# Patient Record
Sex: Female | Born: 1975
Health system: Southern US, Community
[De-identification: ages and names within clinical notes are randomized; demographics above are authoritative.]

## PROBLEM LIST (undated history)

## (undated) DIAGNOSIS — D069 Carcinoma in situ of cervix, unspecified: Secondary | ICD-10-CM

## (undated) DIAGNOSIS — L709 Acne, unspecified: Secondary | ICD-10-CM

## (undated) DIAGNOSIS — M419 Scoliosis, unspecified: Secondary | ICD-10-CM

## (undated) DIAGNOSIS — D649 Anemia, unspecified: Secondary | ICD-10-CM

## (undated) DIAGNOSIS — K219 Gastro-esophageal reflux disease without esophagitis: Secondary | ICD-10-CM

## (undated) DIAGNOSIS — F419 Anxiety disorder, unspecified: Secondary | ICD-10-CM

## (undated) HISTORY — DX: Acne, unspecified: L70.9

## (undated) HISTORY — DX: Carcinoma in situ of cervix, unspecified: D06.9

## (undated) HISTORY — DX: Scoliosis, unspecified: M41.9

## (undated) HISTORY — DX: Gastro-esophageal reflux disease without esophagitis: K21.9

## (undated) HISTORY — PX: COLONOSCOPY: SHX174

## (undated) HISTORY — PX: COSMETIC SURGERY: SHX468

## (undated) HISTORY — DX: Anxiety disorder, unspecified: F41.9

---

## 2003-06-11 HISTORY — PX: LEEP: SHX91

## 2004-03-01 ENCOUNTER — Other Ambulatory Visit: Admission: RE | Admit: 2004-03-01 | Discharge: 2004-03-01 | Payer: Self-pay | Admitting: Obstetrics and Gynecology

## 2004-04-04 ENCOUNTER — Encounter (INDEPENDENT_AMBULATORY_CARE_PROVIDER_SITE_OTHER): Payer: Self-pay | Admitting: Specialist

## 2004-04-04 ENCOUNTER — Ambulatory Visit (HOSPITAL_COMMUNITY): Admission: RE | Admit: 2004-04-04 | Discharge: 2004-04-04 | Payer: Self-pay | Admitting: Obstetrics and Gynecology

## 2004-08-16 ENCOUNTER — Other Ambulatory Visit: Admission: RE | Admit: 2004-08-16 | Discharge: 2004-08-16 | Payer: Self-pay | Admitting: Obstetrics and Gynecology

## 2004-11-23 ENCOUNTER — Other Ambulatory Visit: Admission: RE | Admit: 2004-11-23 | Discharge: 2004-11-23 | Payer: Self-pay | Admitting: Obstetrics and Gynecology

## 2005-03-20 ENCOUNTER — Other Ambulatory Visit: Admission: RE | Admit: 2005-03-20 | Discharge: 2005-03-20 | Payer: Self-pay | Admitting: Obstetrics and Gynecology

## 2005-06-27 ENCOUNTER — Other Ambulatory Visit: Admission: RE | Admit: 2005-06-27 | Discharge: 2005-06-27 | Payer: Self-pay | Admitting: Obstetrics and Gynecology

## 2006-04-09 ENCOUNTER — Other Ambulatory Visit: Admission: RE | Admit: 2006-04-09 | Discharge: 2006-04-09 | Payer: Self-pay | Admitting: Obstetrics and Gynecology

## 2006-06-10 HISTORY — PX: AUGMENTATION MAMMAPLASTY: SUR837

## 2006-08-04 ENCOUNTER — Encounter: Admission: RE | Admit: 2006-08-04 | Discharge: 2006-08-04 | Payer: Self-pay | Admitting: Physician Assistant

## 2006-08-04 ENCOUNTER — Ambulatory Visit: Payer: Self-pay | Admitting: Family Medicine

## 2006-08-06 ENCOUNTER — Encounter: Admission: RE | Admit: 2006-08-06 | Discharge: 2006-08-06 | Payer: Self-pay | Admitting: Physician Assistant

## 2007-09-09 ENCOUNTER — Ambulatory Visit: Payer: Self-pay | Admitting: Family Medicine

## 2008-05-16 ENCOUNTER — Ambulatory Visit: Payer: Self-pay | Admitting: Family Medicine

## 2008-05-30 ENCOUNTER — Encounter: Admission: RE | Admit: 2008-05-30 | Discharge: 2008-05-30 | Payer: Self-pay | Admitting: Family Medicine

## 2008-05-30 ENCOUNTER — Ambulatory Visit: Payer: Self-pay | Admitting: Family Medicine

## 2009-07-24 ENCOUNTER — Ambulatory Visit: Payer: Self-pay | Admitting: Family Medicine

## 2009-12-18 ENCOUNTER — Ambulatory Visit: Payer: Self-pay | Admitting: Physician Assistant

## 2010-02-28 ENCOUNTER — Ambulatory Visit (HOSPITAL_COMMUNITY): Admission: RE | Admit: 2010-02-28 | Discharge: 2010-02-28 | Payer: Self-pay | Admitting: Obstetrics and Gynecology

## 2010-07-31 ENCOUNTER — Ambulatory Visit (INDEPENDENT_AMBULATORY_CARE_PROVIDER_SITE_OTHER): Payer: BC Managed Care – PPO | Admitting: Family Medicine

## 2010-07-31 DIAGNOSIS — J029 Acute pharyngitis, unspecified: Secondary | ICD-10-CM

## 2010-07-31 DIAGNOSIS — J209 Acute bronchitis, unspecified: Secondary | ICD-10-CM

## 2010-08-13 ENCOUNTER — Inpatient Hospital Stay (HOSPITAL_COMMUNITY): Admission: AD | Admit: 2010-08-13 | Payer: Self-pay | Admitting: Obstetrics and Gynecology

## 2010-08-17 ENCOUNTER — Inpatient Hospital Stay (HOSPITAL_COMMUNITY)
Admission: RE | Admit: 2010-08-17 | Discharge: 2010-08-19 | DRG: 373 | Disposition: A | Discharge status: Home / Self Care | Payer: BC Managed Care – PPO | Source: Ambulatory Visit | Attending: Obstetrics and Gynecology | Admitting: Obstetrics and Gynecology

## 2010-08-17 DIAGNOSIS — O99892 Other specified diseases and conditions complicating childbirth: Secondary | ICD-10-CM | POA: Diagnosis present

## 2010-08-17 DIAGNOSIS — Z22330 Carrier of Group B streptococcus: Secondary | ICD-10-CM

## 2010-08-17 DIAGNOSIS — O09529 Supervision of elderly multigravida, unspecified trimester: Secondary | ICD-10-CM | POA: Diagnosis present

## 2010-08-17 LAB — CBC
HCT: 34.8 % — ABNORMAL LOW (ref 36.0–46.0)
Hemoglobin: 11.5 g/dL — ABNORMAL LOW (ref 12.0–15.0)
MCH: 28.5 pg (ref 26.0–34.0)
MCV: 86.1 fL (ref 78.0–100.0)
Platelets: 278 10*3/uL (ref 150–400)
RDW: 12.8 % (ref 11.5–15.5)
WBC: 8.1 10*3/uL (ref 4.0–10.5)

## 2010-08-18 LAB — CBC
HCT: 30 % — ABNORMAL LOW (ref 36.0–46.0)
Hemoglobin: 9.9 g/dL — ABNORMAL LOW (ref 12.0–15.0)
MCH: 28.6 pg (ref 26.0–34.0)
MCV: 86.7 fL (ref 78.0–100.0)
RBC: 3.46 MIL/uL — ABNORMAL LOW (ref 3.87–5.11)

## 2010-08-20 ENCOUNTER — Ambulatory Visit (HOSPITAL_COMMUNITY)
Admission: RE | Admit: 2010-08-20 | Discharge: 2010-08-20 | Disposition: A | Discharge status: Home / Self Care | Payer: BC Managed Care – PPO | Source: Ambulatory Visit | Attending: Obstetrics & Gynecology | Admitting: Obstetrics & Gynecology

## 2010-08-20 DIAGNOSIS — O9279 Other disorders of lactation: Secondary | ICD-10-CM | POA: Insufficient documentation

## 2010-08-20 DIAGNOSIS — O9212 Cracked nipple associated with the puerperium: Secondary | ICD-10-CM | POA: Insufficient documentation

## 2010-08-27 ENCOUNTER — Ambulatory Visit (HOSPITAL_COMMUNITY)
Admission: RE | Admit: 2010-08-27 | Discharge: 2010-08-27 | Disposition: A | Discharge status: Home / Self Care | Payer: BC Managed Care – PPO | Source: Ambulatory Visit | Attending: Obstetrics & Gynecology | Admitting: Obstetrics & Gynecology

## 2010-08-27 DIAGNOSIS — O923 Agalactia: Secondary | ICD-10-CM | POA: Insufficient documentation

## 2010-10-09 ENCOUNTER — Ambulatory Visit (INDEPENDENT_AMBULATORY_CARE_PROVIDER_SITE_OTHER): Payer: BC Managed Care – PPO | Admitting: Family Medicine

## 2010-10-09 ENCOUNTER — Ambulatory Visit: Payer: BC Managed Care – PPO | Admitting: Family Medicine

## 2010-10-09 DIAGNOSIS — N61 Mastitis without abscess: Secondary | ICD-10-CM

## 2010-10-26 NOTE — Op Note (Signed)
NAME:  Vanessa Daniels, Vanessa Daniels             ACCOUNT NO.:  [id number]   MEDICAL RECORD NO.:  [id number]          PATIENT TYPE:  AMB   LOCATION:  SDC                           FACILITY:  [facility]   PHYSICIAN:  James A. Ashley Royalty, M.D.DATE OF BIRTH:  1976-01-08   DATE OF PROCEDURE:  04/04/2004  DATE OF DISCHARGE:                                 OPERATIVE REPORT   PREOPERATIVE DIAGNOSES:  1.  CIN 3.  2.  Recent near syncopal episode during colposcopy at the office.   POSTOPERATIVE DIAGNOSES:  1.  CIN 3.  2.  Recent near syncopal episode during colposcopy at the office.  Path      pending.   PROCEDURE:  Loop electrical excision procedure (LEEP).   SURGEON:  Rudy Jew. Ashley Royalty, M.D.   ANESTHESIA:  Monitored anesthesia care with 1% Xylocaine paracervical block  (15 mL).   COMPLICATIONS:  None.   PACKS/DRAINS:  None.   DESCRIPTION OF PROCEDURE:  The patient was taken to the operating room and  placed in the dorsal supine position.  After IV sedation was administered,  she was placed in the lithotomy position.  A coated speculum was placed per  vagina with _________ smoke evacuation.  The cervix was then visualized  through the colposcope.  5% acetic acid was applied.  A sizeable area of  white epithelium was noted anteriorly. The squamocolumnar junction was seen  in its entirety.  Next, the cervix was cleansed with Betadine and grasped on  the anterior lip with the single tooth tenaculum.  Approximately 15 mL of 1%  Xylocaine were infiltrated circumferentially to create a paracervical block.  Next the white [facility] loop electrode was incorporated in order to  perform the loop electrical excision. Using the 70 watts cutting wave form  and visualized through the colposcope, the procedure was easily and  satisfactorily conducted. The entire lesion was excised. The specimen was  incised at 12 o'clock and submitted to pathology for histologic studies in  saline.  Hemostasis was obtained using the  ball electrode at approximately  70 watts coagulation wave form. Hemostasis was noted.  Monsel solution was  applied to the cervical bed as an added protector from subsequent bleeding.  Hemostasis was noted and the procedure terminated. All instruments were  removed and the patient taken to the recovery room in satisfactory  condition.      JAM/MEDQ  D:  04/04/2004  T:  04/04/2004  Job:  [id number]

## 2010-10-26 NOTE — H&P (Signed)
NAME:  Vanessa Daniels, Vanessa Daniels             ACCOUNT NO.:  [id number]   MEDICAL RECORD NO.:  [id number]          PATIENT TYPE:  AMB   LOCATION:  SDC                           [facility]ACILITY:  [facility]   PHYSICIAN:  James A. Ashley Royalty, M.D.DATE OF BIRTH:  Apr 24, 1976   DATE OF ADMISSION:  DATE OF DISCHARGE:                                HISTORY & PHYSICAL   DATE OF ADMISSION:  April 04, 2004   HISTORY OF PRESENT ILLNESS:  This is a 35 year old gravida 2 para 0 AB 2  referred for evaluation of a Bartholin cyst.  In the course of the  evaluation, a Pap smear was done which revealed high-risk HPV.  Subsequent  colposcopy revealed  large area of apparent mosaicism extending to  approximately 1.2 cm from the squamocolumnar junction.  Three  colposcopically-directed biopsies were obtained, all of which showed CIN-3.  In the course of the procedure, the patient stated I don't feel well.  The  blood pressure and pulse remained normal and she maintained consciousness  throughout.  However, due to that event, it was felt the patient would best  be treated in a setting of monitored anesthesia.   MEDICATIONS:  Ortho-Novum 7/7/7.   PAST MEDICAL HISTORY:  Medical:  Negative.  Surgical:  Breast augmentation,  liposuction.   ALLERGIES:  None.   FAMILY HISTORY:  Positive for diabetes.   SOCIAL HISTORY:  The patient denies use of tobacco or significant alcohol.   REVIEW OF SYSTEMS:  Noncontributory.   PHYSICAL EXAMINATION:  GENERAL:  Well-developed,well-nourished, pleasant  female in no acute distress.  Afebrile, vital signs stable.  SKIN:  Warm and dry without lesions.  LYMPH:  There is no supraclavicular or inguinal adenopathy.  HEENT:  Normocephalic.  NECK:  Supple without thyromegaly.  CHEST:  Lungs are clear.  CARDIAC:  Regular rate and rhythm without murmurs, gallops, or rubs.  BREAST:  Deferred.  ABDOMEN:  Soft and nontender without masses or organomegaly.  Bowel sounds  are active.  MUSCULOSKELETAL:   No CVA tenderness.  PELVIC:  (March 01, 2004) External genitalia within normal limits.  Vagina and cervix are without gross lesions.  Bimanual examination reveals  the uterus to be approximately 8 x 4 x 4 cm and no adnexal masses are  palpable.   IMPRESSION:  1.  Cervical intraepithelial neoplasia grade 3 on colposcopically-directed      biopsies.  2.  Near-syncopal episode at the time of colposcopy.  3.  Status post breast augmentation and liposuction.   PLAN:  Loop electrical excision procedure.  Risks, benefits, complications,  and alternatives fully discussed with the patient.  She states she  understands and accepts.  Questions invited and answered.      JAM/MEDQ  D:  04/04/2004  T:  04/04/2004  Job:  [id number]

## 2011-02-26 ENCOUNTER — Encounter: Payer: Self-pay | Admitting: Family Medicine

## 2011-02-26 ENCOUNTER — Ambulatory Visit (INDEPENDENT_AMBULATORY_CARE_PROVIDER_SITE_OTHER): Payer: BC Managed Care – PPO | Admitting: Family Medicine

## 2011-02-26 VITALS — BP 110/70 | HR 74 | Temp 98.7°F | Wt 141.0 lb

## 2011-02-26 DIAGNOSIS — J069 Acute upper respiratory infection, unspecified: Secondary | ICD-10-CM

## 2011-02-26 DIAGNOSIS — B070 Plantar wart: Secondary | ICD-10-CM

## 2011-02-26 MED ORDER — CHLORPHENIRAMINE-HYDROCODONE 8-10 MG/5ML PO LQCR: 5.0000 mL | Freq: Two times a day (BID) | ORAL | Status: DC | PRN

## 2011-02-26 MED ORDER — VALACYCLOVIR HCL 1 G PO TABS: 2000.0000 mg | ORAL_TABLET | Freq: Two times a day (BID) | ORAL | Status: DC

## 2011-02-26 NOTE — Progress Notes (Signed)
  Subjective:    Patient ID: Vanessa Daniels, female    DOB: 1975/10/26, 35 y.o.   MRN: [id number]  HPI She has a five-day history that started with a slight sore throat followed by rhinorrhea, fever, malaise and 3 days later a dry cough. The cough is now productive. She has tried NyQuil and dextromethorphan with out success. She also has a lesion on her left foot that she would like evaluated . She would also like her Valtrex renewed. She usually gets herpes a BLS with this   Review of Systems     Objective:   Physical Exam alert and in no distress. Tympanic membranes and canals are normal. Throat is clear. Tonsils are normal. Neck is supple without adenopathy or thyromegaly. Cardiac exam shows a regular sinus rhythm without murmurs or gallops. Lungs are clear to auscultation.        Assessment & Plan:   1. URI, acute   2. Plantar wart of left foot    Supportive care for the URI with the use of Tussionex. Also recommend freezing the wart and if further trouble, return here.

## 2011-02-26 NOTE — Progress Notes (Signed)
Addended by: Ronnald Nian on: 02/26/2011 11:38 AM   Modules accepted: Orders

## 2011-02-26 NOTE — Patient Instructions (Signed)
Valtrex prescription written and instructions given on proper use for treatment of herpes labialis.

## 2011-04-15 ENCOUNTER — Other Ambulatory Visit: Payer: Self-pay | Admitting: Family Medicine

## 2011-04-15 MED ORDER — VALACYCLOVIR HCL 1 G PO TABS: 2000.0000 mg | ORAL_TABLET | Freq: Two times a day (BID) | ORAL | Status: AC

## 2011-05-16 ENCOUNTER — Encounter: Payer: Self-pay | Admitting: Internal Medicine

## 2011-05-23 ENCOUNTER — Other Ambulatory Visit (INDEPENDENT_AMBULATORY_CARE_PROVIDER_SITE_OTHER): Payer: BC Managed Care – PPO

## 2011-05-23 DIAGNOSIS — Z23 Encounter for immunization: Secondary | ICD-10-CM

## 2011-05-24 ENCOUNTER — Other Ambulatory Visit: Payer: BC Managed Care – PPO

## 2011-07-01 ENCOUNTER — Telehealth: Payer: Self-pay | Admitting: Family Medicine

## 2011-07-01 ENCOUNTER — Other Ambulatory Visit: Payer: Self-pay | Admitting: Family Medicine

## 2011-07-01 DIAGNOSIS — J019 Acute sinusitis, unspecified: Secondary | ICD-10-CM

## 2011-07-01 MED ORDER — SULFAMETHOXAZOLE-TRIMETHOPRIM 800-160 MG PO TABS: 1.0000 | ORAL_TABLET | Freq: Two times a day (BID) | ORAL | Status: AC

## 2011-07-01 MED ORDER — AMOXICILLIN-POT CLAVULANATE 875-125 MG PO TABS: 1.0000 | ORAL_TABLET | Freq: Two times a day (BID) | ORAL | Status: AC

## 2011-07-01 NOTE — Telephone Encounter (Signed)
Pt informed

## 2011-07-01 NOTE — Telephone Encounter (Signed)
Let her know that I called in a different antibiotic for her. 

## 2011-07-01 NOTE — Progress Notes (Signed)
She has been self-medicating with Amoxil for sinus disease. I will place her on Augmentin. She is to use the entire dosing and call me at the end of the dosing

## 2011-07-01 NOTE — Telephone Encounter (Signed)
Apparently Augmentin is upsetting her stomach, I will switch her to [facility]

## 2011-11-27 ENCOUNTER — Ambulatory Visit (INDEPENDENT_AMBULATORY_CARE_PROVIDER_SITE_OTHER): Payer: BC Managed Care – PPO | Admitting: Family Medicine

## 2011-11-27 VITALS — BP 114/70 | HR 116 | Wt 130.0 lb

## 2011-11-27 DIAGNOSIS — K120 Recurrent oral aphthae: Secondary | ICD-10-CM

## 2011-11-27 NOTE — Progress Notes (Signed)
  Subjective:    Patient ID: Vanessa Daniels, female    DOB: [birth year], 36 y.o.   MRN: [id number]  HPI She complains of a slightly uncomfortable lesion on the roof of the mouth on the right that she noted several days ago. No sore throat, earache, fever or chills.   Review of Systems     Objective:   Physical Exam A 0.5 cm erythematous ulcerated lesion noted on the hard palate to the right of the midline. No other lesions seen in the throat. Tongue is normal. Neck is supple without adenopathy or      Assessment & Plan:   1. Aphthous ulcer of mouth    I reassured her that nothing needed to be done other than supportive care.

## 2012-02-03 ENCOUNTER — Other Ambulatory Visit: Payer: Self-pay | Admitting: Family Medicine

## 2012-02-03 MED ORDER — ERYTHROMYCIN 5 MG/GM OP OINT: TOPICAL_OINTMENT | OPHTHALMIC | Status: DC

## 2012-04-07 ENCOUNTER — Ambulatory Visit (INDEPENDENT_AMBULATORY_CARE_PROVIDER_SITE_OTHER): Payer: BC Managed Care – PPO | Admitting: Family Medicine

## 2012-04-07 ENCOUNTER — Encounter: Payer: Self-pay | Admitting: Family Medicine

## 2012-04-07 VITALS — BP 114/72 | HR 99 | Ht 63.0 in | Wt 134.0 lb

## 2012-04-07 DIAGNOSIS — Z23 Encounter for immunization: Secondary | ICD-10-CM

## 2012-04-07 DIAGNOSIS — Z0000 Encounter for general adult medical examination without abnormal findings: Secondary | ICD-10-CM

## 2012-04-07 DIAGNOSIS — D069 Carcinoma in situ of cervix, unspecified: Secondary | ICD-10-CM

## 2012-04-07 LAB — POCT URINALYSIS DIPSTICK
Bilirubin, UA: NEGATIVE
Glucose, UA: NEGATIVE
Leukocytes, UA: NEGATIVE

## 2012-04-07 NOTE — Progress Notes (Signed)
Subjective:    Patient ID: Vanessa Daniels, female    DOB: 24-Jul-1975, 36 y.o.   MRN: [id number]  HPI She is here for general examination. She does see her gynecologist regularly for routine Pap. She does have a history of CIN-3. She continues to work and take care of her young son. This keeps her quite busy. She does complain of some fatigue at the end of the day. She does smoke roughly 4 cigarettes per day that she states is more for stress management. Review of her immunizations indicates she needs further immunizations.   Review of Systems  Constitutional: Negative.   HENT: Negative.   Eyes: Negative.   Respiratory: Negative.   Cardiovascular: Negative.   Gastrointestinal: Negative.   Genitourinary: Negative.   Musculoskeletal: Negative.   Skin: Negative.   Neurological: Negative.   Hematological: Negative.   Psychiatric/Behavioral: Negative.        Objective:   Physical Exam BP 114/72  Pulse 99  Ht 5\' 3"  (1.6 m)  Wt 134 lb (60.782 kg)  BMI 23.74 kg/m2  SpO2 99%  General Appearance:    Alert, cooperative, no distress, appears stated age  Head:    Normocephalic, without obvious abnormality, atraumatic  Eyes:    PERRL, conjunctiva/corneas clear, EOM's intact, fundi    benign  Ears:    Normal TM's and external ear canals  Nose:   Nares normal, mucosa normal, no drainage or sinus   tenderness  Throat:   Lips, mucosa, and tongue normal; teeth and gums normal  Neck:   Supple, no lymphadenopathy;  thyroid:  no   enlargement/tenderness/nodules; no carotid   bruit or JVD  Back:    Spine nontender, no curvature, ROM normal, no CVA     tenderness  Lungs:     Clear to auscultation bilaterally without wheezes, rales or     ronchi; respirations unlabored  Chest Wall:    No tenderness or deformity   Heart:    Regular rate and rhythm, S1 and S2 normal, no murmur, rub   or gallop  Breast Exam:    Deferred to GYN  Abdomen:     Soft, non-tender, nondistended, normoactive bowel  sounds,    no masses, no hepatosplenomegaly  Genitalia:    Deferred to GYN     Extremities:   No clubbing, cyanosis or edema  Pulses:   2+ and symmetric all extremities  Skin:   Skin color, texture, turgor normal, no rashes or lesions  Lymph nodes:   Cervical, supraclavicular, and axillary nodes normal  Neurologic:   CNII-XII intact, normal strength, sensation and gait; reflexes 2+ and symmetric throughout          Psych:   Normal mood, affect, hygiene and grooming.          Assessment & Plan:   1. Routine general medical examination at a health care facility  POCT Urinalysis Dipstick, Tdap vaccine greater than or equal to 7yo IM, Lipid panel, CBC with Differential, Comprehensive metabolic panel  2. Need for prophylactic vaccination and inoculation against influenza  Flu vaccine greater than or equal to 3yo preservative free IM, Tdap vaccine greater than or equal to 7yo IM  3. CIN III (cervical intraepithelial neoplasia III)     she will followup with her gynecologist. Also encouraged her to get more physically active to help with the fatigue. We discussed smoking especially in regard to her using it for relaxation and stress. Discussed various options for this including using the serenity  prayer and reevaluating whether to let distress her that much control over her. Also discussed behavior patterns concerning her son and to discipline issues. Flu shot given with risks and benefits discussed

## 2012-04-08 LAB — COMPREHENSIVE METABOLIC PANEL
BUN: 12 mg/dL (ref 6–23)
CO2: 27 mEq/L (ref 19–32)
Calcium: 9.3 mg/dL (ref 8.4–10.5)
Potassium: 3.8 mEq/L (ref 3.5–5.3)
Total Protein: 7.5 g/dL (ref 6.0–8.3)

## 2012-04-08 LAB — LIPID PANEL
Cholesterol: 183 mg/dL (ref 0–200)
Total CHOL/HDL Ratio: 3.2 Ratio
Triglycerides: 99 mg/dL (ref <150)
VLDL: 20 mg/dL (ref 0–40)

## 2012-04-08 LAB — CBC WITH DIFFERENTIAL/PLATELET
Eosinophils Absolute: 0.2 10*3/uL (ref 0.0–0.7)
Eosinophils Relative: 2 % (ref 0–5)
Lymphs Abs: 2.4 10*3/uL (ref 0.7–4.0)
MCV: 83.4 fL (ref 78.0–100.0)
Monocytes Relative: 5 % (ref 3–12)
Platelets: 354 10*3/uL (ref 150–400)
RBC: 4.87 MIL/uL (ref 3.87–5.11)
WBC: 9.4 10*3/uL (ref 4.0–10.5)

## 2012-04-08 NOTE — Progress Notes (Signed)
Quick Note:  The blood work is normal ______ 

## 2012-06-30 ENCOUNTER — Ambulatory Visit (INDEPENDENT_AMBULATORY_CARE_PROVIDER_SITE_OTHER): Payer: BC Managed Care – PPO | Admitting: Medical

## 2012-06-30 ENCOUNTER — Encounter: Payer: Self-pay | Admitting: Medical

## 2012-06-30 VITALS — BP 110/78 | HR 80 | Temp 98.1°F | Resp 16 | Wt 140.0 lb

## 2012-06-30 DIAGNOSIS — J029 Acute pharyngitis, unspecified: Secondary | ICD-10-CM

## 2012-06-30 DIAGNOSIS — J069 Acute upper respiratory infection, unspecified: Secondary | ICD-10-CM

## 2012-06-30 DIAGNOSIS — R509 Fever, unspecified: Secondary | ICD-10-CM

## 2012-06-30 NOTE — Progress Notes (Signed)
Subjective:  Here for 3 day hx/o fevers up to 100, chills, body aches, nasal drainage, ear fullness and popping and eyes watering.  The eye watering, fever and chills have resolved.  This morning throat felt like it was closing up, painful, worse on the left.  Son has been sick too.  She notes some nausea, no vomiting, but no cough, no chest congestion, no diarrhea. No rash.  No abdominal pain.  She has not had a recent close exposure to someone with proven streptococcal pharyngitis.  But lots of sick contacts currently on husbands side of family.  Treatment to date: none.  No other aggravating or relieving factors.  No other c/o.  The following portions of the patient's history were reviewed and updated as appropriate: allergies, current medications, past family history, past medical history, past social history, past surgical history and problem list.    Past Medical History  Diagnosis Date  . Acne   . Scoliosis     MILD DDD AND MILD SCOLIOSIS  . Anxiety   . CIN 3 - cervical intraepithelial neoplasia grade 3     Objective: Filed Vitals:   06/30/12 1503  BP: 110/78  Pulse: 80  Temp: 98.1 F (36.7 C)  Resp: 16    General appearance: no distress, WD/WN, somewhat ill appearing HEENT: normocephalic, conjunctiva/corneas normal, sclerae anicteric, nares patent, no discharge or erythema, pharynx with erythema, no exudate.  Oral cavity: MMM, no lesions  Neck: supple, shotty anterior lymph nodes and submandibular, no thyromegaly Heart: RRR, normal S1, S2, no murmurs Lungs: CTA bilaterally, no wheezes, rhonchi, or rales   Laboratory Strep test done. Results:negative.    Assessment and Plan: Encounter Diagnoses  Name Primary?  . Pharyngitis Yes  . Fever   . URI (upper respiratory infection)     Advised that symptoms and exam suggest a viral etiology.  Discussed symptomatic treatment including salt water gargles, warm fluids, rest, hydrate well, can use over-the-counter Tylenol or  Ibuprofen for throat pain, fever, or malaise. If worse or not improving within 2-3 days, call or return.

## 2012-07-01 LAB — POCT RAPID STREP A (OFFICE): Rapid Strep A Screen: NEGATIVE

## 2013-01-26 ENCOUNTER — Ambulatory Visit (INDEPENDENT_AMBULATORY_CARE_PROVIDER_SITE_OTHER): Payer: BC Managed Care – PPO | Admitting: Family Medicine

## 2013-01-26 ENCOUNTER — Encounter: Payer: Self-pay | Admitting: Family Medicine

## 2013-01-26 VITALS — BP 104/60 | HR 98 | Wt 129.0 lb

## 2013-01-26 DIAGNOSIS — M79662 Pain in left lower leg: Secondary | ICD-10-CM

## 2013-01-26 DIAGNOSIS — M79609 Pain in unspecified limb: Secondary | ICD-10-CM

## 2013-01-26 NOTE — Progress Notes (Signed)
  Subjective:    Patient ID: Vanessa Daniels, female    DOB: July 10, 1975, 37 y.o.   MRN: [id number]  HPI Approximately 3 weeks ago she noted the onset of left calf pain. It's in mid calf area. No new physical activities or injuries. There is no relation to physical activity. No relation to sitting for long periods of time. Pain is slightly worse when she walks. No chest pain, shortness of breath.   Review of Systems     Objective:   Physical Exam , The left leg shows slight mid calf tenderness however Homans sign was negative. Full motion of the knee and ankle. No skin lesions are noted. Pulses are normal.      Assessment & Plan:  Calf pain, left  explained that I did not think this was DVT that most likely this a musculoskeletal issue. Recommend conservative care for this and if continued difficulty, reevaluation.

## 2013-01-26 NOTE — Patient Instructions (Signed)
Heat for 20 minutes 3 times per day. 4 Advil 3 times per day. You might also find that shoes with a little bit of a heel will take some pressure off.

## 2013-01-29 ENCOUNTER — Other Ambulatory Visit: Payer: Self-pay | Admitting: Family Medicine

## 2013-01-29 MED ORDER — VALACYCLOVIR HCL 1 G PO TABS: ORAL_TABLET | ORAL | Status: DC

## 2013-03-08 ENCOUNTER — Encounter: Payer: Self-pay | Admitting: Internal Medicine

## 2013-06-28 ENCOUNTER — Encounter: Payer: Self-pay | Admitting: Family Medicine

## 2013-06-28 ENCOUNTER — Ambulatory Visit (INDEPENDENT_AMBULATORY_CARE_PROVIDER_SITE_OTHER): Payer: BC Managed Care – PPO | Admitting: Family Medicine

## 2013-06-28 VITALS — BP 112/70 | HR 107 | Wt 131.0 lb

## 2013-06-28 DIAGNOSIS — M940 Chondrocostal junction syndrome [Tietze]: Secondary | ICD-10-CM

## 2013-06-28 NOTE — Progress Notes (Signed)
   Subjective:    Patient ID: Vanessa Daniels, female    DOB: 30-Mar-1976, 38 y.o.   MRN: [id number]  HPI She complains of intermittent left-sided chest pain that she notes occurs with certain motions of her chest. She also notes occasionally taking a deep breath will cause pain in the left sternal area. No dyspnea on exertion, PND, cough or congestion.  Review of Systems     Objective:   Physical Exam Alert and in no distress. Cardiac exam shows regular rhythm without murmurs or gallop. Lungs clear to auscultation. Tender to palpation over the left second and third costochondral junctions       Assessment & Plan:  Costochondritis  I reassured her that this was nothing of any major concern and recommend anti-inflammatory of choice on an as-needed basis.

## 2013-07-24 ENCOUNTER — Other Ambulatory Visit: Payer: Self-pay | Admitting: Family Medicine

## 2013-07-24 MED ORDER — HYDROCOD POLST-CHLORPHEN POLST 10-8 MG/5ML PO LQCR: 5.0000 mL | Freq: Two times a day (BID) | ORAL | Status: DC | PRN

## 2013-07-28 ENCOUNTER — Encounter: Payer: Self-pay | Admitting: Family Medicine

## 2013-07-28 ENCOUNTER — Ambulatory Visit (INDEPENDENT_AMBULATORY_CARE_PROVIDER_SITE_OTHER): Payer: BC Managed Care – PPO | Admitting: Family Medicine

## 2013-07-28 VITALS — BP 114/72 | HR 76 | Wt 131.0 lb

## 2013-07-28 DIAGNOSIS — J209 Acute bronchitis, unspecified: Secondary | ICD-10-CM

## 2013-07-28 DIAGNOSIS — H6690 Otitis media, unspecified, unspecified ear: Secondary | ICD-10-CM

## 2013-07-28 DIAGNOSIS — L739 Follicular disorder, unspecified: Secondary | ICD-10-CM

## 2013-07-28 DIAGNOSIS — M940 Chondrocostal junction syndrome [Tietze]: Secondary | ICD-10-CM

## 2013-07-28 DIAGNOSIS — H6691 Otitis media, unspecified, right ear: Secondary | ICD-10-CM

## 2013-07-28 DIAGNOSIS — L738 Other specified follicular disorders: Secondary | ICD-10-CM

## 2013-07-28 MED ORDER — AMOXICILLIN-POT CLAVULANATE 875-125 MG PO TABS: 1.0000 | ORAL_TABLET | Freq: Two times a day (BID) | ORAL | Status: DC

## 2013-07-28 MED ORDER — DICLOFENAC SODIUM 75 MG PO TBEC: 75.0000 mg | DELAYED_RELEASE_TABLET | Freq: Two times a day (BID) | ORAL | Status: DC

## 2013-07-28 NOTE — Progress Notes (Signed)
   Subjective:    Patient ID: Vanessa Daniels, female    DOB: 1976-03-07, 38 y.o.   MRN: [id number]  HPI 5 days ago she developed a sudden onset of fever, chills, malaise and fatigue with myalgias and chest congestion. She has been taking ibuprofen regularly area she still feels quite weak and is having a slightly productive cough she's having some midepigastric discomfort due to the coughing. She also complains of slight discomfort in her 8 year and notes a swollen tender lesion in her left axilla. She continues to complain of left-sided chest pain and states that sometimes radiates down her arm. She did take the ibuprofen intermittently since last being seen for costochondritis.    Review of Systems     Objective:   Physical Exam alert and in no distress. Tympanic membrane on the right is slightly dull and vascular, left is normal canals are normal. Throat is clear. Tonsils are normal. Neck is supple without adenopathy or thyromegaly. Cardiac exam shows a regular sinus rhythm without murmurs or gallops. Lungs are clear to auscultation. Tender to palpation over the left second costochondral junction. No other point tenderness is noted. Left axilla shows a 1 cm slightly erythematous tender lesion       Assessment & Plan:  Costochondritis - Plan: diclofenac (VOLTAREN) 75 MG EC tablet  ROM (right otitis media) - Plan: amoxicillin-clavulanate (AUGMENTIN) 875-125 MG per tablet  Acute bronchitis - Plan: amoxicillin-clavulanate (AUGMENTIN) 875-125 MG per tablet  Folliculitis - Plan: amoxicillin-clavulanate (AUGMENTIN) 875-125 MG per tablet  I reassured her that all her problems are short-term. I reassured her that I did not think this was heart or lung related I will switch her to Voltaren and give her Augmentin. She will call if further trouble.

## 2013-07-28 NOTE — Patient Instructions (Signed)
Take both medications until they're gone. You can also take Tylenol for fever aches and pains. Use heat on your test for 20 minutes a couple times of the

## 2014-05-11 ENCOUNTER — Ambulatory Visit (INDEPENDENT_AMBULATORY_CARE_PROVIDER_SITE_OTHER): Payer: BC Managed Care – PPO | Admitting: Family Medicine

## 2014-05-11 ENCOUNTER — Telehealth: Payer: Self-pay | Admitting: Family Medicine

## 2014-05-11 ENCOUNTER — Encounter: Payer: Self-pay | Admitting: Family Medicine

## 2014-05-11 VITALS — BP 110/60 | HR 61 | Temp 98.1°F | Wt 137.0 lb

## 2014-05-11 DIAGNOSIS — J209 Acute bronchitis, unspecified: Secondary | ICD-10-CM

## 2014-05-11 MED ORDER — AMOXICILLIN 875 MG PO TABS: 875.0000 mg | ORAL_TABLET | Freq: Two times a day (BID) | ORAL | Status: DC

## 2014-05-11 NOTE — Telephone Encounter (Signed)
Pt says nausea meds has helped with nausea but now she is coughing nonstop and green mucus sometimes comes up with cough. Has headache most of time as well. Pt asked for appt to see Dr Redmond School since he is familiar with what is going on with pt but Dr Redmond School does not have any appt's left today and will be out of office the rest of week. Wants to see if Dr Redmond School can call in med or can fit her in for appt today if he thinks she needs to be seen.

## 2014-05-11 NOTE — Patient Instructions (Signed)
Use NyQuil at night and Robitussin-DM during the day

## 2014-05-11 NOTE — Telephone Encounter (Signed)
Pt coming in for appt today

## 2014-05-11 NOTE — Progress Notes (Signed)
   Subjective:    Patient ID: Vanessa Daniels, female    DOB: 18-Jan-1976, 38 y.o.   MRN: [id number]  HPI 8 days ago she started having difficulty with a productive cough. No fever, chills, sore throat or earache. 4 days ago she developed nausea and headache. Headache has continued and she has used Advil twice per day with good results.   Review of Systems     Objective:   Physical Exam alert and in no distress. Tympanic membranes and canals are normal. Throat is clear. Tonsils are normal. Neck is supple without adenopathy or thyromegaly. Cardiac exam shows a regular sinus rhythm without murmurs or gallops. Lungs are clear to auscultation.        Assessment & Plan:  Acute bronchitis, unspecified organism - Plan: amoxicillin (AMOXIL) 875 MG tablet  recommend NyQuil at night and Robitussin-DM during the day. She will call if further difficulty. OTC meds for her headaches should continue.

## 2014-05-31 ENCOUNTER — Other Ambulatory Visit: Payer: Self-pay

## 2014-06-01 LAB — CYTOLOGY - PAP

## 2014-08-20 ENCOUNTER — Other Ambulatory Visit: Payer: Self-pay | Admitting: Family Medicine

## 2014-08-20 MED ORDER — VALACYCLOVIR HCL 1 G PO TABS: ORAL_TABLET | ORAL | Status: DC

## 2015-06-02 ENCOUNTER — Other Ambulatory Visit: Payer: Self-pay | Admitting: Obstetrics & Gynecology

## 2015-06-07 LAB — CYTOLOGY - PAP

## 2015-07-03 ENCOUNTER — Encounter: Payer: Self-pay | Admitting: Family Medicine

## 2015-07-03 ENCOUNTER — Ambulatory Visit (INDEPENDENT_AMBULATORY_CARE_PROVIDER_SITE_OTHER): Payer: BLUE CROSS/BLUE SHIELD | Admitting: Family Medicine

## 2015-07-03 VITALS — BP 110/78 | HR 98 | Ht 63.0 in | Wt 143.2 lb

## 2015-07-03 DIAGNOSIS — R109 Unspecified abdominal pain: Secondary | ICD-10-CM | POA: Diagnosis not present

## 2015-07-03 LAB — POCT URINALYSIS DIPSTICK
BILIRUBIN UA: NEGATIVE
GLUCOSE UA: NEGATIVE
KETONES UA: NEGATIVE
Leukocytes, UA: NEGATIVE
Nitrite, UA: NEGATIVE
PH UA: 7
Protein, UA: NEGATIVE
RBC UA: NEGATIVE
Spec Grav, UA: 1.02
Urobilinogen, UA: 4

## 2015-07-03 NOTE — Progress Notes (Signed)
   Subjective:    Patient ID: Vanessa Daniels, female    DOB: 1976/01/06, 40 y.o.   MRN: [id number]  HPI She is here for evaluation of right lower quadrant cramping type pain that started January 9 after returning from a cruise. Though she describes this as cramping, she said is somewhat constant. She does state that her bowel habits have been somewhat irregular since her return but she cannot relate the pain to having a BM or not. No urinary symptoms food she cannot relate this to food. Her menses was November 11 and normal.  Review of Systems     Objective:   Physical Exam Alert and in no distress. Abdominal exam shows no masses and very minimal tenderness in the right lower quadrant. Urinalysis was negative.       Assessment & Plan:  Abdominal pain, unspecified abdominal location - Plan: POCT urinalysis dipstick I explained that her symptoms were difficult to assess completely but recommended fluids, bulk in diet and exercise to keep her regular. Also recommend she pick attention to anything that makes the symptoms worse or better.

## 2015-07-03 NOTE — Patient Instructions (Signed)
You can take 800 mg of ibuprofen 3 times per day the pain. Fluids, bulk in your diet, exercise, listen to your body in terms of having a BM

## 2015-07-20 ENCOUNTER — Other Ambulatory Visit: Payer: Self-pay | Admitting: Obstetrics & Gynecology

## 2015-10-31 ENCOUNTER — Encounter: Payer: Self-pay | Admitting: Family Medicine

## 2015-10-31 ENCOUNTER — Ambulatory Visit (INDEPENDENT_AMBULATORY_CARE_PROVIDER_SITE_OTHER): Payer: BLUE CROSS/BLUE SHIELD | Admitting: Family Medicine

## 2015-10-31 VITALS — BP 146/90 | HR 88 | Wt 139.0 lb

## 2015-10-31 DIAGNOSIS — H6692 Otitis media, unspecified, left ear: Secondary | ICD-10-CM | POA: Diagnosis not present

## 2015-10-31 MED ORDER — CLARITHROMYCIN 500 MG PO TABS: 500.0000 mg | ORAL_TABLET | Freq: Two times a day (BID) | ORAL | Status: DC

## 2015-10-31 NOTE — Progress Notes (Signed)
Subjective:     Patient ID: Vanessa Daniels, female   DOB: 12/06/1975, 40 y.o.   MRN: [id number]  HPI Vanessa Daniels presents to clinic with a 2 day history of left ear pressure, hearing loss, and preauricular tenderness to touch after swimming at the beach this weekend.  She denies fever, cough, PND, sore throat, and any drainage from the ear.  She tried to clean the ear out herself with a q-tip, but said that this did not help her symptoms.    She has severe nausea when taking Augmentin.    Review of Systems     Objective:   Physical Exam Alert and in no distress.  Right tympanic membrane and canal is normal.   Left TM cloudy with erythamatous area around the malleolus.  Some erythema and evidence of trauma in left ear canal.   Pharyngeal area is normal. Neck is supple without adenopathy or thyromegaly.  Cardiac exam shows a regular sinus rhythm without murmurs or gallops.  Lungs are clear to auscultation.     Assessment:     Left otitis media, recurrence not specified, unspecified chronicity, unspecified otitis media type - Plan: clarithromycin (BIAXIN) 500 MG tablet  Given TM findings and augmentin side effect will treat AOM with clarithromycin.

## 2015-11-09 ENCOUNTER — Encounter: Payer: Self-pay | Admitting: Family Medicine

## 2015-11-09 ENCOUNTER — Ambulatory Visit (INDEPENDENT_AMBULATORY_CARE_PROVIDER_SITE_OTHER): Payer: BLUE CROSS/BLUE SHIELD | Admitting: Family Medicine

## 2015-11-09 VITALS — BP 110/70 | HR 95 | Wt 138.0 lb

## 2015-11-09 DIAGNOSIS — H6692 Otitis media, unspecified, left ear: Secondary | ICD-10-CM | POA: Diagnosis not present

## 2015-11-09 MED ORDER — CEFDINIR 300 MG PO CAPS: 300.0000 mg | ORAL_CAPSULE | Freq: Two times a day (BID) | ORAL | Status: DC

## 2015-11-09 NOTE — Progress Notes (Signed)
Subjective:     Patient ID: Vanessa Daniels, female   DOB: 1975/06/28, 40 y.o.   MRN: [id number]  HPI Vanessa Daniels presents to clinic today with an 11 day history of left ear fullness and hearing loss after a 7 day course of clairthromycin.  She denies fever, sore throat, nasal congestion, facial pain, rhinorrhea, tinnitus, dizziness, nausea, and vomiting.  She has severe nausea with augmentin but has taken amoxicillin many times in the past with no side effects.    Review of Systems     Objective:   Physical Exam Alert and in no distress. Purulent fluid visible behind left tympanic membrane with complete loss of bony landmarks.   Pharyngeal area is normal. Neck is supple without adenopathy or thyromegaly.  Cardiac exam shows a regular sinus rhythm without murmurs or gallops.  Lungs are clear to auscultation.     Assessment / Plan:     Left otitis media, recurrence not specified, unspecified chronicity, unspecified otitis media type - Plan: cefdinir (OMNICEF) 300 MG capsule  Given her intolerance to augmentin and failure of clarithromycin, will try 10 days of cefdinir.  She will follow up on complete of course if symptoms do not completely resolve.      History and physical exam conducted by Marylen Ponto (Medical Student) in conjunction with Dr. Redmond School.

## 2015-11-15 ENCOUNTER — Telehealth: Payer: Self-pay | Admitting: Internal Medicine

## 2015-11-15 NOTE — Telephone Encounter (Signed)
JCL called and stated pt needs a ENT referral for on going issues with ear pain unresponsive to pain medication. He states it is getting worse. Sending to [facility] for referral. JCL would like her to be seen ASAP.

## 2015-11-15 NOTE — Telephone Encounter (Signed)
Pt is scheduled with Dr. Simeon Craft on 11/16/15 @ 9:00am at [facility] but can not make it so she is going to call and get it rescheduled. [phone]

## 2015-12-25 ENCOUNTER — Ambulatory Visit (INDEPENDENT_AMBULATORY_CARE_PROVIDER_SITE_OTHER): Payer: BLUE CROSS/BLUE SHIELD | Admitting: Family Medicine

## 2015-12-25 ENCOUNTER — Encounter: Payer: Self-pay | Admitting: Family Medicine

## 2015-12-25 VITALS — BP 120/80 | HR 84 | Ht 64.0 in | Wt 137.0 lb

## 2015-12-25 DIAGNOSIS — R51 Headache: Secondary | ICD-10-CM

## 2015-12-25 DIAGNOSIS — R519 Headache, unspecified: Secondary | ICD-10-CM

## 2015-12-25 DIAGNOSIS — R5383 Other fatigue: Secondary | ICD-10-CM

## 2015-12-25 LAB — CBC WITH DIFFERENTIAL/PLATELET
BASOS PCT: 1 %
Basophils Absolute: 71 cells/uL (ref 0–200)
EOS PCT: 1 %
Eosinophils Absolute: 71 cells/uL (ref 15–500)
HEMATOCRIT: 40.7 % (ref 35.0–45.0)
Hemoglobin: 13.5 g/dL (ref 11.7–15.5)
LYMPHS PCT: 22 %
Lymphs Abs: 1562 cells/uL (ref 850–3900)
MCH: 28.5 pg (ref 27.0–33.0)
MCHC: 33.2 g/dL (ref 32.0–36.0)
MCV: 86 fL (ref 80.0–100.0)
MONO ABS: 355 {cells}/uL (ref 200–950)
MONOS PCT: 5 %
MPV: 8.8 fL (ref 7.5–12.5)
Neutro Abs: 5041 cells/uL (ref 1500–7800)
Neutrophils Relative %: 71 %
Platelets: 352 10*3/uL (ref 140–400)
RBC: 4.73 MIL/uL (ref 3.80–5.10)
RDW: 13.5 % (ref 11.0–15.0)
WBC: 7.1 10*3/uL (ref 4.0–10.5)

## 2015-12-25 MED ORDER — KETOROLAC TROMETHAMINE 60 MG/2ML IM SOLN
60.0000 mg | Freq: Once | INTRAMUSCULAR | Status: AC
  Administered 2015-12-25: 60 mg via INTRAMUSCULAR

## 2015-12-25 NOTE — Patient Instructions (Addendum)
Take Aleve twice daily with food.  If it doesn't help with your pain, but doesn't bother your stomach, you can increase to 2 pills twice daily.  You can take this for up to 10 days, but can stop as soon as you feel 100% better.  You may use tylenol along with the aleve, if needed for pain. Don't use excedrin, ibuprofen, or any other pain relievers.  Use ice and/or heat to the temples and painful areas of your scalp (use whichever seems to work the best; alternate if they are both helpful). Avoid chewing gum, clenching/grinding your teeth).  Your nasal exam suggests allergies--try claritin or zyrtec or allegra, if needed.  Head Injury, Adult You have received a head injury. It does not appear serious at this time. Headaches and vomiting are common following head injury. It should be easy to awaken from sleeping. Sometimes it is necessary for you to stay in the emergency department for a while for observation. Sometimes admission to the hospital may be needed. After injuries such as yours, most problems occur within the first 24 hours, but side effects may occur up to 7-10 days after the injury. It is important for you to carefully monitor your condition and contact your health care provider or seek immediate medical care if there is a change in your condition. WHAT ARE THE TYPES OF HEAD INJURIES? Head injuries can be as minor as a bump. Some head injuries can be more severe. More severe head injuries include:  A jarring injury to the brain (concussion).  A bruise of the brain (contusion). This mean there is bleeding in the brain that can cause swelling.  A cracked skull (skull fracture).  Bleeding in the brain that collects, clots, and forms a bump (hematoma). WHAT CAUSES A HEAD INJURY? A serious head injury is most likely to happen to someone who is in a car wreck and is not wearing a seat belt. Other causes of major head injuries include bicycle or motorcycle accidents, sports injuries, and  falls. HOW ARE HEAD INJURIES DIAGNOSED? A complete history of the event leading to the injury and your current symptoms will be helpful in diagnosing head injuries. Many times, pictures of the brain, such as CT or MRI are needed to see the extent of the injury. Often, an overnight hospital stay is necessary for observation.  WHEN SHOULD I SEEK IMMEDIATE MEDICAL CARE?  You should get help right away if:  You have confusion or drowsiness.  You feel sick to your stomach (nauseous) or have continued, forceful vomiting.  You have dizziness or unsteadiness that is getting worse.  You have severe, continued headaches not relieved by medicine. Only take over-the-counter or prescription medicines for pain, fever, or discomfort as directed by your health care provider.  You do not have normal function of the arms or legs or are unable to walk.  You notice changes in the black spots in the center of the colored part of your eye (pupil).  You have a clear or bloody fluid coming from your nose or ears.  You have a loss of vision. During the next 24 hours after the injury, you must stay with someone who can watch you for the warning signs. This person should contact local emergency services (911 in the [place] if you have seizures, you become unconscious, or you are unable to wake up. HOW CAN I PREVENT A HEAD INJURY IN THE FUTURE? The most important factor for preventing major head injuries is avoiding motor  vehicle accidents. To minimize the potential for damage to your head, it is crucial to wear seat belts while riding in motor vehicles. Wearing helmets while bike riding and playing collision sports (like football) is also helpful. Also, avoiding dangerous activities around the house will further help reduce your risk of head injury.  WHEN CAN I RETURN TO NORMAL ACTIVITIES AND ATHLETICS? You should be reevaluated by your health care provider before returning to these activities. If you have any of the  following symptoms, you should not return to activities or contact sports until 1 week after the symptoms have stopped:  Persistent headache.  Dizziness or vertigo.  Poor attention and concentration.  Confusion.  Memory problems.  Nausea or vomiting.  Fatigue or tire easily.  Irritability.  Intolerant of bright lights or loud noises.  Anxiety or depression.  Disturbed sleep. MAKE SURE YOU:   Understand these instructions.  Will watch your condition.  Will get help right away if you are not doing well or get worse.   This information is not intended to replace advice given to you by your health care provider. Make sure you discuss any questions you have with your health care provider.   Document Released: 05/27/2005 Document Revised: 06/17/2014 Document Reviewed: 02/01/2013 Elsevier Interactive Patient Education [employer].

## 2015-12-25 NOTE — Progress Notes (Signed)
Chief Complaint  Patient presents with  . Headache    hit back of head with a 20lbs free weight at the gym around noon. By 2pm she started having a bad headache, also some nausea and just feels out of it. Took excedrin and tylenol and ibu none of which helped.   Injury occurred 7/13.  While doing triceps exercise with trainer, holding 20# weight above her head, and lowering behind her, she hit the back of her head with the weight.  Felt fine except for localized pain initially.  No LOC, just tender to touch.  She later developed a headache at the left parietal area and the top of her head while at work. Excedrin didn't help.  She felt fine when she woke up the next morning, but 10am developed another headache.  Needed to leave work early.  Took 2 excedrin, didn't really help.  Pain was described as a dull ache, at same location--top of head and left parietal area. Didn't take other medications.  Headache lasted all day.  Still had headache when she woke up on Saturday morning. Took 3 tylenol without benefit.  Yesterday she woke up and felt fine, but recurred around noon.  Last night pain was 5/10 (feels more like a 7 when the 41 yo is around!).  Woke up still with discomfort this morning.  Same areas--left parietal scalp, but now also making its way towards the center. Feels good for her to put pressure on the head.  She has had some intermittent nausea, but has had decreased appetite, not eating much. Denies vomiting. Denies bluried vision, but feels "out of it", detached. No other neuro symptoms. Feels drained. Wondering if it is all related to the injury, or if something else could be going on.  PMH, PSH, SH reviewed  No current outpatient prescriptions on file prior to visit.   No current facility-administered medications on file prior to visit.   Allergies  Allergen Reactions  . Augmentin [Amoxicillin-Pot Clavulanate] Nausea And Vomiting   ROS: She recently had ear infection, completed  antibiotics.  Denies ear pain, significant congestion, sinus pain, sore throat, cough. No fever, chills.  Thinks she might clench her teeth at night; denies stress. No chest pain, shortness of breath, rashes, depression, anxiety or other complaints  PHYSICAL EXAM:  BP 120/80 mmHg  Pulse 84  Ht 5' 4"  (1.626 m)  Wt 137 lb (62.143 kg)  BMI 23.50 kg/m2  LMP 12/22/2015  Well developed, pleasant, somewhat tired appearing female, in no distress HEENT: PERRL, EOMI, conjunctiva and sclera are clear.  Tender bitemporally (but also described the pressure feeling good). Also tender at left parietal area. No STS posteriorly at area of injury Nasal mucosa is mod edematous, pale, clear mucus Left EAC shows some irritation at distal canal, posteriorly.  TM's and EAC's are normal. OP is clear Neck: c-spine is nontender. No thyromegaly or lymphadenopathy or mass Heart: regular rate and rhythm without murmur Lungs: clear bilaterally Abdomen: soft, nontender, no organomegaly or mass Extremities: no edema Neuro: alert and oriented, cranial nerves intact. Normal strength, gait. DTR's 1+ and symmetric.   ASSESSMENT/PLAN:  Other fatigue - check labs; allergies may contribute - Plan: TSH, CBC with Differential/Platelet, Comprehensive metabolic panel, VITAMIN D 25 Hydroxy (Vit-D Deficiency, Fractures)  Headache, unspecified headache type - some component of muscular/tension headache. Doubt significant concussion. normal neuro exam. Trial NSAIDs - Plan: ketorolac (TORADOL) injection 60 mg   Vit D, CBC, TSH, chem  toradol 57m IM  Take Aleve twice daily with food.  If it doesn't help with your pain, but doesn't bother your stomach, you can increase to 2 pills twice daily.  You can take this for up to 10 days, but can stop as soon as you feel 100% better.  You may use tylenol along with the aleve, if needed for pain. Don't use excedrin, ibuprofen, or any other pain relievers.  Use ice and/or heat  to the temples and painful areas of your scalp (use whichever seems to work the best; alternate if they are both helpful). Avoid chewing gum, clenching/grinding your teeth).  Your nasal exam suggests allergies--try claritin or zyrtec or allegra, if needed.

## 2015-12-26 LAB — COMPREHENSIVE METABOLIC PANEL
ALBUMIN: 4.5 g/dL (ref 3.6–5.1)
ALT: 18 U/L (ref 6–29)
AST: 21 U/L (ref 10–30)
Alkaline Phosphatase: 40 U/L (ref 33–115)
BILIRUBIN TOTAL: 0.4 mg/dL (ref 0.2–1.2)
BUN: 9 mg/dL (ref 7–25)
CALCIUM: 9.5 mg/dL (ref 8.6–10.2)
CHLORIDE: 103 mmol/L (ref 98–110)
CO2: 26 mmol/L (ref 20–31)
Creat: 0.75 mg/dL (ref 0.50–1.10)
Glucose, Bld: 88 mg/dL (ref 65–99)
POTASSIUM: 4.4 mmol/L (ref 3.5–5.3)
Sodium: 139 mmol/L (ref 135–146)
Total Protein: 7 g/dL (ref 6.1–8.1)

## 2015-12-26 LAB — VITAMIN D 25 HYDROXY (VIT D DEFICIENCY, FRACTURES): Vit D, 25-Hydroxy: 34 ng/mL (ref 30–100)

## 2015-12-26 LAB — TSH: TSH: 0.44 m[IU]/L

## 2016-02-08 ENCOUNTER — Telehealth: Payer: Self-pay | Admitting: Family Medicine

## 2016-02-08 ENCOUNTER — Other Ambulatory Visit: Payer: Self-pay

## 2016-02-08 MED ORDER — ALPRAZOLAM 0.25 MG PO TABS: 0.2500 mg | ORAL_TABLET | Freq: Two times a day (BID) | ORAL | 0 refills | Status: DC | PRN

## 2016-02-08 NOTE — Telephone Encounter (Signed)
Call the Xanax in

## 2016-02-08 NOTE — Telephone Encounter (Signed)
Pt getting ready to fly tomorrow and request refill on Xanax to [facility]

## 2016-02-08 NOTE — Telephone Encounter (Signed)
Called xanax

## 2016-02-08 NOTE — Telephone Encounter (Signed)
I have called in xanax per jcl

## 2016-03-06 ENCOUNTER — Encounter: Payer: Self-pay | Admitting: Family Medicine

## 2016-03-06 ENCOUNTER — Ambulatory Visit (INDEPENDENT_AMBULATORY_CARE_PROVIDER_SITE_OTHER): Payer: BLUE CROSS/BLUE SHIELD | Admitting: Family Medicine

## 2016-03-06 VITALS — BP 112/72 | HR 80 | Ht 64.0 in | Wt 137.0 lb

## 2016-03-06 DIAGNOSIS — K648 Other hemorrhoids: Secondary | ICD-10-CM

## 2016-03-06 DIAGNOSIS — Z23 Encounter for immunization: Secondary | ICD-10-CM

## 2016-03-06 DIAGNOSIS — K644 Residual hemorrhoidal skin tags: Secondary | ICD-10-CM

## 2016-03-06 MED ORDER — HYDROCORTISONE 2.5 % RE CREA: 1.0000 "application " | TOPICAL_CREAM | Freq: Two times a day (BID) | RECTAL | 0 refills | Status: DC

## 2016-03-06 NOTE — Progress Notes (Signed)
Chief Complaint  Patient presents with  . Hemorrhoids    thinks she has an external hemorrhoid. Very painful to sit and/or walk. No rectal bleeding. Started yesterday.    Patient presents with 1 day of right sided rectal/anal pain and swelling.  Denies any bleeding. It became swollen and painful yesterday. Denies any constipation, straining or diarrhea. She has been lifting heavy weights regularly/weekly (every Monday). She tried preparation H without benefit.  PMH, PSH, SH reviewed  Current Outpatient Prescriptions on File Prior to Visit  Medication Sig Dispense Refill  . ALPRAZolam (XANAX) 0.25 MG tablet Take 1 tablet (0.25 mg total) by mouth 2 (two) times daily as needed for anxiety. (Patient not taking: Reported on 03/06/2016) 12 tablet 0   No current facility-administered medications on file prior to visit.    Allergies  Allergen Reactions  . Augmentin [Amoxicillin-Pot Clavulanate] Nausea And Vomiting   ROS: No fever, chills, nausea, vomiting, bleeding, URI symptoms, abdominal pain, or other complaints except as noted in HPI   PHYSICAL EXAM:  BP 112/72 (BP Location: Left Arm, Patient Position: Sitting, Cuff Size: Normal)   Pulse 80   Ht 5' 4"  (1.626 m)   Wt 137 lb (62.1 kg)   LMP 02/09/2016 (Approximate)   BMI 23.52 kg/m   Well developed, pleasant female, sitting slightly more on the left buttock than the right due to discomfort Rectal exam: 1.5 cm swollen area on the right side of the external rectal area. Bluish discoloration uniformly, soft.  Slightly tender on the lateral aspect, but unable to appreciate significant hard area/thrombosis or color change to this area. No bleeding noted   ASSESSMENT/PLAN:  External hemorrhoid - Plan: hydrocortisone (ANUSOL-HC) 2.5 % rectal cream  Need for prophylactic vaccination and inoculation against influenza - Plan: Flu Vaccine QUAD 36+ mos PF IM (Fluarix & Fluzone Quad PF)    External hemorrhoidal flare.  Cannot rule out  early thrombosis.   Supportive measures reviewed (Sitz/soaks, Anusol HC). Avoid heavy lifting. If acute increase in pain, may be thrombosed, and return for I&D (vs refer to surgeon). Consider surgical consult if frequent flares (has had hemorrhoids treated in the past).   Dr. Redmond School aware

## 2016-03-06 NOTE — Patient Instructions (Signed)

## 2016-05-09 ENCOUNTER — Telehealth: Payer: Self-pay | Admitting: Family Medicine

## 2016-05-09 ENCOUNTER — Other Ambulatory Visit: Payer: Self-pay

## 2016-05-09 MED ORDER — VALACYCLOVIR HCL 1 G PO TABS: 2000.0000 mg | ORAL_TABLET | Freq: Two times a day (BID) | ORAL | 0 refills | Status: DC

## 2016-05-09 NOTE — Telephone Encounter (Signed)
Sent in what was in her chart

## 2016-05-09 NOTE — Telephone Encounter (Signed)
Check  with her on that dosing and call it in for her

## 2016-05-09 NOTE — Telephone Encounter (Signed)
Pt called and is requesting a RX on valtrex pt can be reached at [phone] and pt uses [id number], [place] informed pt that you was  Out of the office today

## 2016-06-12 ENCOUNTER — Telehealth: Payer: Self-pay | Admitting: Family Medicine

## 2016-06-12 MED ORDER — VALACYCLOVIR HCL 1 G PO TABS: 2000.0000 mg | ORAL_TABLET | Freq: Two times a day (BID) | ORAL | 5 refills | Status: DC

## 2016-06-12 NOTE — Telephone Encounter (Signed)
Pt called and is requesting a refill on her valtrex pt uses [id number] [address], [place] and pt can be reached at [phone] with any questions

## 2016-08-15 ENCOUNTER — Other Ambulatory Visit: Payer: Self-pay | Admitting: Obstetrics & Gynecology

## 2016-08-15 DIAGNOSIS — Z01419 Encounter for gynecological examination (general) (routine) without abnormal findings: Secondary | ICD-10-CM | POA: Diagnosis not present

## 2016-08-15 DIAGNOSIS — Z124 Encounter for screening for malignant neoplasm of cervix: Secondary | ICD-10-CM | POA: Diagnosis not present

## 2016-08-16 LAB — CYTOLOGY - PAP

## 2016-10-24 DIAGNOSIS — Z1231 Encounter for screening mammogram for malignant neoplasm of breast: Secondary | ICD-10-CM | POA: Diagnosis not present

## 2016-12-18 ENCOUNTER — Telehealth: Payer: Self-pay | Admitting: Family Medicine

## 2016-12-18 MED ORDER — ALPRAZOLAM 0.25 MG PO TABS
0.2500 mg | ORAL_TABLET | Freq: Two times a day (BID) | ORAL | 0 refills | Status: DC | PRN
Start: 2016-12-18 — End: 2018-01-07

## 2016-12-18 NOTE — Telephone Encounter (Signed)
Called in med to pharmacy

## 2016-12-18 NOTE — Telephone Encounter (Signed)
ok 

## 2016-12-18 NOTE — Telephone Encounter (Signed)
Pt is getting ready to do quite a bit of traveling and want to know if she can get a refill on Xanax. She uses this for traveling.

## 2017-03-16 DIAGNOSIS — Z23 Encounter for immunization: Secondary | ICD-10-CM | POA: Diagnosis not present

## 2017-11-13 ENCOUNTER — Ambulatory Visit: Payer: 59 | Admitting: Family Medicine

## 2017-11-13 ENCOUNTER — Encounter: Payer: Self-pay | Admitting: Family Medicine

## 2017-11-13 VITALS — BP 120/70 | HR 101 | Temp 98.2°F | Ht 64.0 in | Wt 153.4 lb

## 2017-11-13 DIAGNOSIS — F40240 Claustrophobia: Secondary | ICD-10-CM

## 2017-11-13 DIAGNOSIS — H6692 Otitis media, unspecified, left ear: Secondary | ICD-10-CM | POA: Diagnosis not present

## 2017-11-13 DIAGNOSIS — Z6379 Other stressful life events affecting family and household: Secondary | ICD-10-CM

## 2017-11-13 DIAGNOSIS — Z8619 Personal history of other infectious and parasitic diseases: Secondary | ICD-10-CM | POA: Diagnosis not present

## 2017-11-13 DIAGNOSIS — R748 Abnormal levels of other serum enzymes: Secondary | ICD-10-CM

## 2017-11-13 DIAGNOSIS — Z0000 Encounter for general adult medical examination without abnormal findings: Secondary | ICD-10-CM

## 2017-11-13 LAB — POCT URINALYSIS DIP (PROADVANTAGE DEVICE)
BILIRUBIN UA: NEGATIVE
GLUCOSE UA: NEGATIVE mg/dL
Ketones, POC UA: NEGATIVE mg/dL
LEUKOCYTES UA: NEGATIVE
NITRITE UA: NEGATIVE
Protein Ur, POC: NEGATIVE mg/dL
RBC UA: NEGATIVE
Specific Gravity, Urine: 1.015
UUROB: 3.5
pH, UA: 8 (ref 5.0–8.0)

## 2017-11-13 MED ORDER — CITALOPRAM HYDROBROMIDE 10 MG PO TABS: 10.0000 mg | ORAL_TABLET | Freq: Every day | ORAL | 5 refills | Status: DC

## 2017-11-13 MED ORDER — AMOXICILLIN 875 MG PO TABS: 875.0000 mg | ORAL_TABLET | Freq: Two times a day (BID) | ORAL | 0 refills | Status: DC

## 2017-11-13 NOTE — Progress Notes (Signed)
Subjective:    Patient ID: Vanessa Daniels, female    DOB: 1975-07-17, 42 y.o.   MRN: [id number]  HPI She is here for complete examination.  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  She is being followed closely by GYN and does plan to go back for a follow-up within the next several months.  She also has a history of herpes labialis and does respond nicely to Valtrex.  She also complains of decreased hearing in the left ear but no earache, fever, chills, cough, congestion or sore throat.  Also she has been having to take a lot of time off from work to help deal with her son and various social and psychological issues revolving around him.  This is creating a great deal of stress with her.  During the same timeframe she is also noted worsening of her claustrophobia symptoms.  This is now starting to interfere with quality of her life.  Personally things are going well and that she is in a good relationship that does seem to be long-term although marriage is not in the equation at the present time.  She has been also going to the gym and has noted increase in size especially in her upper body.  Otherwise her family and social history as well as health maintenance and immunizations were reviewed.   Review of Systems  All other systems reviewed and are negative.      Objective:   Physical Exam BP 120/70 (BP Location: Left Arm, Patient Position: Sitting)   Pulse (!) 101   Temp 98.2 F (36.8 C)   Ht 5' 4"  (1.626 m)   Wt 153 lb 6.4 oz (69.6 kg)   SpO2 98%   BMI 26.33 kg/m   General Appearance:    Alert, cooperative, no distress, appears stated age  Head:    Normocephalic, without obvious abnormality, atraumatic  Eyes:    PERRL, conjunctiva/corneas clear, EOM's intact, fundi    benign  Ears:   Left TM does show erythema in the area of the malleus with the tympanic membrane to be slightly dull and retracted, right is normal, external ear canals normal  Nose:   Nares  normal, mucosa normal, no drainage or sinus   tenderness  Throat:   Lips, mucosa, and tongue normal; teeth and gums normal  Neck:   Supple, no lymphadenopathy;  thyroid:  no   enlargement/tenderness/nodules; no carotid   bruit or JVD     Lungs:     Clear to auscultation bilaterally without wheezes, rales or     ronchi; respirations unlabored      Heart:    Regular rate and rhythm, S1 and S2 normal, no murmur, rub   or gallop  Breast Exam:    Deferred to GYN  Abdomen:     Soft, non-tender, nondistended, normoactive bowel sounds,    no masses, no hepatosplenomegaly  Genitalia:    Deferred to GYN     Extremities:   No clubbing, cyanosis or edema  Pulses:   2+ and symmetric all extremities  Skin:   Skin color, texture, turgor normal, no rashes or lesions  Lymph nodes:   Cervical, supraclavicular, and axillary nodes normal  Neurologic:   CNII-XII intact, normal strength, sensation and gait; reflexes 2+ and symmetric throughout          Psych:   Normal mood, affect, hygiene and grooming.  Assessment & Plan:  Routine general medical examination at a health care facility - Plan: CBC with Differential/Platelet, Comprehensive metabolic panel, Lipid panel  History of herpes labialis  Left otitis media, unspecified otitis media type - Plan: amoxicillin (AMOXIL) 875 MG tablet  Claustrophobia - Plan: citalopram (CELEXA) 10 MG tablet  Stressful life events affecting family and household - Plan: citalopram (CELEXA) 10 MG tablet She will call when she needs a refill on her Valtrex. I think she has otitis media and will therefore treated with Amoxil.  She will call if no improvement. Place her on low-dose Celexa to see if we can help mildly with the claustrophobia but also dealing with her very stressful life revolving around various appointments for her son.  He will keep me informed.

## 2017-11-13 NOTE — Addendum Note (Signed)
Addended by: Elyse Jarvis on: 11/13/2017 03:19 PM   Modules accepted: Orders

## 2017-11-14 LAB — CBC WITH DIFFERENTIAL/PLATELET
Basophils Absolute: 0 10*3/uL (ref 0.0–0.2)
Basos: 1 %
EOS (ABSOLUTE): 0 10*3/uL (ref 0.0–0.4)
Eos: 1 %
Hematocrit: 44.4 % (ref 34.0–46.6)
Hemoglobin: 14.4 g/dL (ref 11.1–15.9)
IMMATURE GRANS (ABS): 0 10*3/uL (ref 0.0–0.1)
Immature Granulocytes: 0 %
LYMPHS: 19 %
Lymphocytes Absolute: 1.1 10*3/uL (ref 0.7–3.1)
MCH: 29.1 pg (ref 26.6–33.0)
MCHC: 32.4 g/dL (ref 31.5–35.7)
MCV: 90 fL (ref 79–97)
Monocytes Absolute: 0.3 10*3/uL (ref 0.1–0.9)
Monocytes: 5 %
NEUTROS PCT: 74 %
Neutrophils Absolute: 4.5 10*3/uL (ref 1.4–7.0)
PLATELETS: 347 10*3/uL (ref 150–450)
RBC: 4.95 x10E6/uL (ref 3.77–5.28)
RDW: 13 % (ref 12.3–15.4)
WBC: 6.1 10*3/uL (ref 3.4–10.8)

## 2017-11-14 LAB — LIPID PANEL
CHOLESTEROL TOTAL: 178 mg/dL (ref 100–199)
Chol/HDL Ratio: 2.6 ratio (ref 0.0–4.4)
HDL: 69 mg/dL (ref >39)
LDL Calculated: 96 mg/dL (ref 0–99)
Triglycerides: 63 mg/dL (ref 0–149)
VLDL CHOLESTEROL CAL: 13 mg/dL (ref 5–40)

## 2017-11-14 LAB — COMPREHENSIVE METABOLIC PANEL
A/G RATIO: 1.6 (ref 1.2–2.2)
ALBUMIN: 4.6 g/dL (ref 3.5–5.5)
ALT: 60 IU/L — ABNORMAL HIGH (ref 0–32)
AST: 50 IU/L — ABNORMAL HIGH (ref 0–40)
Alkaline Phosphatase: 51 IU/L (ref 39–117)
BILIRUBIN TOTAL: 0.3 mg/dL (ref 0.0–1.2)
BUN / CREAT RATIO: 11 (ref 9–23)
BUN: 9 mg/dL (ref 6–24)
CHLORIDE: 100 mmol/L (ref 96–106)
CO2: 25 mmol/L (ref 20–29)
Calcium: 9.6 mg/dL (ref 8.7–10.2)
Creatinine, Ser: 0.79 mg/dL (ref 0.57–1.00)
GFR calc Af Amer: 107 mL/min/{1.73_m2} (ref >59)
GFR calc non Af Amer: 93 mL/min/{1.73_m2} (ref >59)
GLUCOSE: 99 mg/dL (ref 65–99)
Globulin, Total: 2.8 g/dL (ref 1.5–4.5)
POTASSIUM: 4.1 mmol/L (ref 3.5–5.2)
Sodium: 137 mmol/L (ref 134–144)
Total Protein: 7.4 g/dL (ref 6.0–8.5)

## 2017-11-14 NOTE — Addendum Note (Signed)
Addended by: Denita Lung on: 11/14/2017 08:29 AM   Modules accepted: Orders

## 2017-12-09 DIAGNOSIS — Z124 Encounter for screening for malignant neoplasm of cervix: Secondary | ICD-10-CM | POA: Diagnosis not present

## 2017-12-09 DIAGNOSIS — Z01419 Encounter for gynecological examination (general) (routine) without abnormal findings: Secondary | ICD-10-CM | POA: Diagnosis not present

## 2017-12-09 DIAGNOSIS — Z1231 Encounter for screening mammogram for malignant neoplasm of breast: Secondary | ICD-10-CM | POA: Diagnosis not present

## 2017-12-09 LAB — HM PAP SMEAR

## 2018-01-07 ENCOUNTER — Telehealth: Payer: Self-pay | Admitting: Family Medicine

## 2018-01-07 MED ORDER — ALPRAZOLAM 0.25 MG PO TABS: 0.2500 mg | ORAL_TABLET | Freq: Two times a day (BID) | ORAL | 0 refills | Status: DC | PRN

## 2018-01-07 NOTE — Telephone Encounter (Signed)
Pt called and left voicemail that she needs a refill on her xanax pt would like it sent to the [facility] [place], [place] AT [address] and pt can be reached at [phone]

## 2018-03-01 DIAGNOSIS — Z23 Encounter for immunization: Secondary | ICD-10-CM | POA: Diagnosis not present

## 2018-03-27 ENCOUNTER — Ambulatory Visit: Payer: 59 | Admitting: Family Medicine

## 2018-03-27 ENCOUNTER — Encounter: Payer: Self-pay | Admitting: Family Medicine

## 2018-03-27 VITALS — BP 110/72 | HR 103 | Temp 98.1°F | Wt 159.8 lb

## 2018-03-27 DIAGNOSIS — F439 Reaction to severe stress, unspecified: Secondary | ICD-10-CM

## 2018-03-27 DIAGNOSIS — H6692 Otitis media, unspecified, left ear: Secondary | ICD-10-CM

## 2018-03-27 DIAGNOSIS — Z8619 Personal history of other infectious and parasitic diseases: Secondary | ICD-10-CM | POA: Diagnosis not present

## 2018-03-27 MED ORDER — AMOXICILLIN 875 MG PO TABS: 875.0000 mg | ORAL_TABLET | Freq: Two times a day (BID) | ORAL | 0 refills | Status: DC

## 2018-03-27 MED ORDER — VALACYCLOVIR HCL 1 G PO TABS: 2000.0000 mg | ORAL_TABLET | Freq: Two times a day (BID) | ORAL | 5 refills | Status: DC

## 2018-03-27 NOTE — Progress Notes (Signed)
   Subjective:    Patient ID: Vanessa Daniels, female    DOB: [birth year], 42 y.o.   MRN: [id number]  HPI Complains of a 8-monthhistory of fullness in the left ear but no pain, sore throat, fever, chills.  She has tried Flonase for 3 or 4 weeks and Afrin for a week with no benefit.  She would also like Valtrex renewed for her herpes labialis. She also has been under a lot of stress dealing with her son who is having a lot of academic and behavioral issues.  He apparently has ADHD as well as several other learning disabilities including possibly central auditory processing disorder.   Review of Systems     Objective:   Physical Exam Alert and in no distress. Tympanic membrane on the right is normal, left shows dull tympanic membrane with landmarks being slightly difficult to see, canals are normal. Pharyngeal area is normal. Neck is supple without adenopathy or thyromegaly. Cardiac exam shows a regular sinus rhythm without murmurs or gallops. Lungs are clear to auscultation.        Assessment & Plan:  History of herpes labialis - Plan: valACYclovir (VALTREX) 1000 MG tablet  Left otitis media, unspecified otitis media type - Plan: amoxicillin (AMOXIL) 875 MG tablet  Stress at home  I will place her on Amoxil.  She has had difficulty with Augmentin causing GI symptoms of vomiting.  She is to call me when she finishes that.  If she is making progress I will give her more, if not switch to a different antibiotic and potentially refer to ENT if she continues have difficulty. We then discussed the problems she is having with her son.  He has been seen by neurology.  He does have several issues going on all revolving around ADHD and learning difficulties.  I reviewed his medical record with her.  She does have some renal issues.  Suggested that she become proactive to make sure that he gets the audiology evaluation.  Also recommended she use my chart to send her concerns to neurology to ensure  that they get addressed.  Over 25 minutes, greater than 50% spent in counseling and coordination of care.

## 2018-12-23 LAB — HM PAP SMEAR

## 2018-12-23 LAB — HM MAMMOGRAPHY

## 2019-01-27 ENCOUNTER — Encounter: Payer: Self-pay | Admitting: Family Medicine

## 2019-01-27 DIAGNOSIS — Z8619 Personal history of other infectious and parasitic diseases: Secondary | ICD-10-CM

## 2019-01-27 MED ORDER — VALACYCLOVIR HCL 1 G PO TABS: 2000.0000 mg | ORAL_TABLET | Freq: Two times a day (BID) | ORAL | 5 refills | Status: DC

## 2019-02-04 ENCOUNTER — Encounter: Payer: Self-pay | Admitting: Gastroenterology

## 2019-02-04 ENCOUNTER — Other Ambulatory Visit: Payer: Self-pay

## 2019-02-04 ENCOUNTER — Ambulatory Visit: Payer: 59 | Admitting: Gastroenterology

## 2019-02-04 VITALS — BP 127/60 | HR 82 | Temp 97.1°F | Ht 64.0 in | Wt 158.0 lb

## 2019-02-04 DIAGNOSIS — K6289 Other specified diseases of anus and rectum: Secondary | ICD-10-CM | POA: Diagnosis not present

## 2019-02-04 DIAGNOSIS — K645 Perianal venous thrombosis: Secondary | ICD-10-CM | POA: Diagnosis not present

## 2019-02-04 NOTE — Patient Instructions (Signed)
If you are age 44 or older, your body mass index should be between 23-30. Your Body mass index is 27.12 kg/m. If this is out of the aforementioned range listed, please consider follow up with your Primary Care Provider.  If you are age 30 or younger, your body mass index should be between 19-25. Your Body mass index is 27.12 kg/m. If this is out of the aformentioned range listed, please consider follow up with your Primary Care Provider.   You have been scheduled for an appointment with PA Puja at [facility]. Your appointment is on 02-05-2019 at 4pm . Please arrive at 330pm for registration. Make certain to bring a list of current medications, including any over the counter medications or vitamins. Also bring your co-pay if you have one as well as your insurance cards. [facility] is located at [address], [address]. Should you need to reschedule your appointment, please contact them at [phone].  It was a pleasure to see you today!  Dr. Loletha Carrow

## 2019-02-04 NOTE — Progress Notes (Signed)
[facility] Consult Note:  History: Vanessa Daniels 02/04/2019  Referring provider: Denita Lung, MD  Reason for consult/chief complaint: Rectal Pain   Subjective  HPI:  This is a very pleasant 43 year old woman self-referred for acute onset anorectal pain.  She recalls similar episode years ago treated at [facility], no records currently available.  She describes at that time what sounds like incision and drainage of perhaps thrombosed external hemorrhoid.  At that time she had difficulty with constipation, but now is regular without firm stool straining or rectal bleeding. The night before last she had sudden onset anal rectal pain that waxed and waned over yesterday, then intensified last evening.  She denies abdominal pain nausea vomiting fever or other systemic symptoms.   ROS:  Review of Systems No chest pain dyspnea or dysuria  Past Medical History: Past Medical History:  Diagnosis Date  . Acne   . Anxiety   . CIN 3 - cervical intraepithelial neoplasia grade 3   . Scoliosis    MILD DDD AND MILD SCOLIOSIS     Past Surgical History: Past Surgical History:  Procedure Laterality Date  . COLONOSCOPY    . LEEP  05   J. MATTHEWS     Family History: Family History  Problem Relation Age of Onset  . Diabetes Father   . Colon cancer Paternal Grandfather     Social History: Social History   Socioeconomic History  . Marital status: Single    Spouse name: Not on file  . Number of children: Not on file  . Years of education: Not on file  . Highest education level: Not on file  Occupational History  . Not on file  Social Needs  . Financial resource strain: Not on file  . Food insecurity    Worry: Not on file    Inability: Not on file  . Transportation needs    Medical: Not on file    Non-medical: Not on file  Tobacco Use  . Smoking status: Former Research scientist (life sciences)  . Smokeless tobacco: Never Used  Substance and Sexual  Activity  . Alcohol use: Yes    Alcohol/week: 0.0 standard drinks    Comment: rare  . Drug use: No  . Sexual activity: Yes  Lifestyle  . Physical activity    Days per week: Not on file    Minutes per session: Not on file  . Stress: Not on file  Relationships  . Social Herbalist on phone: Not on file    Gets together: Not on file    Attends religious service: Not on file    Active member of club or organization: Not on file    Attends meetings of clubs or organizations: Not on file    Relationship status: Not on file  Other Topics Concern  . Not on file  Social History Narrative   Works in Nurse, children's.   Has a 56 year old son    73-year-old healthy son Recently engaged to [employer] , 1 of the [employer] primary care providers   allergies: Allergies  Allergen Reactions  . Augmentin [Amoxicillin-Pot Clavulanate] Nausea And Vomiting    Outpatient Meds: Current Outpatient Medications  Medication Sig Dispense Refill  . ALPRAZolam (XANAX) 0.25 MG tablet Take 1 tablet (0.25 mg total) by mouth 2 (two) times daily as needed for anxiety. 12 tablet 0  . hydrocortisone cream (PREPARATION H) 1 % Apply 1 application topically as needed for itching.    Marland Kitchen  valACYclovir (VALTREX) 1000 MG tablet Take 2 tablets (2,000 mg total) by mouth 2 (two) times daily. For one day for breakout 8 tablet 5   No current facility-administered medications for this visit.       ___________________________________________________________________ Objective   Exam:  BP 127/60   Pulse 82   Temp (!) 97.1 F (36.2 C)   Ht 5' 4"  (1.626 m)   Wt 158 lb (71.7 kg)   SpO2 97%   BMI 27.12 kg/m  Exam chaperoned by our [place]: Well-appearing, visibly uncomfortable  Eyes: sclera anicteric, no redness  ENT: oral mucosa moist without lesions, no cervical or supraclavicular lymphadenopathy  CV: RRR without murmur, S1/S2, no JVD, no peripheral edema  Resp: clear to auscultation  bilaterally, normal RR and effort noted  GI: soft, no tenderness, with active bowel sounds. No guarding or palpable organomegaly noted.  Rectal: Tender thrombosed posterior external hemorrhoid.  DRE was performed, no palpable fissure no rectal tenderness or palpable internal lesion.   Assessment: Encounter Diagnoses  Name Primary?  . Thrombosed external hemorrhoid Yes  . Anal pain     She is being sent to [facility] surgery today for probable incision and drainage of this thrombosed external hemorrhoid.   Nelida Meuse III

## 2019-03-15 ENCOUNTER — Other Ambulatory Visit: Payer: Self-pay | Admitting: Physician Assistant

## 2019-03-15 ENCOUNTER — Other Ambulatory Visit: Payer: Self-pay

## 2019-03-15 DIAGNOSIS — R05 Cough: Secondary | ICD-10-CM

## 2019-03-15 DIAGNOSIS — Z20822 Contact with and (suspected) exposure to covid-19: Secondary | ICD-10-CM

## 2019-03-15 DIAGNOSIS — R059 Cough, unspecified: Secondary | ICD-10-CM

## 2019-03-16 LAB — NOVEL CORONAVIRUS, NAA: SARS-CoV-2, NAA: NOT DETECTED

## 2019-07-02 ENCOUNTER — Ambulatory Visit (INDEPENDENT_AMBULATORY_CARE_PROVIDER_SITE_OTHER): Payer: Self-pay | Admitting: Plastic Surgery

## 2019-07-02 ENCOUNTER — Encounter: Payer: Self-pay | Admitting: Plastic Surgery

## 2019-07-02 ENCOUNTER — Other Ambulatory Visit: Payer: Self-pay

## 2019-07-02 VITALS — BP 135/63 | HR 112 | Temp 98.7°F | Ht 64.0 in | Wt 156.0 lb

## 2019-07-02 DIAGNOSIS — Z411 Encounter for cosmetic surgery: Secondary | ICD-10-CM

## 2019-07-02 NOTE — Progress Notes (Signed)
Patient comes in to discuss Botox.  She is bothered by some of the dynamic lines around her glabella and lateral corners of her mouth.  She is also interested in a little bit of a lift of her eyebrow.  On examination her skin quality is quite good.  She does have some natural downturning of the lateral corners of her mouth.  She has very mild dynamic righted's in the glabella and forehead area and minimal crows feet.  40 units of Botox was used.  Lot number is [id number].  Expiration is August 2023.  5 units were placed in either side in the depressor anguli oris.  5 units were placed on either side and the lateral brow area to help with the eyelid lift.  The remaining 20 units were split between the glabella and the forehead.  She tolerated this well.

## 2019-08-30 ENCOUNTER — Ambulatory Visit: Payer: 59

## 2019-09-03 ENCOUNTER — Ambulatory Visit: Payer: 59 | Attending: Internal Medicine

## 2019-09-03 DIAGNOSIS — Z23 Encounter for immunization: Secondary | ICD-10-CM

## 2019-09-03 NOTE — Progress Notes (Signed)
   Covid-19 Vaccination Clinic  Name:  Vanessa Daniels    MRN: [id number] DOB: 09/18/1975  09/03/2019  Vanessa Daniels was observed post Covid-19 immunization for 15 minutes without incident. She was provided with Vaccine Information Sheet and instruction to access the V-Safe system.   Vanessa Daniels was instructed to call 911 with any severe reactions post vaccine: Marland Kitchen Difficulty breathing  . Swelling of face and throat  . A fast heartbeat  . A bad rash all over body  . Dizziness and weakness   Immunizations Administered    Name Date Dose VIS Date Route   Pfizer COVID-19 Vaccine 09/03/2019  8:55 AM 0.3 mL 05/21/2019 Intramuscular   Manufacturer: [place]   Lot: [id number]   [place]: [address]-2

## 2019-09-28 ENCOUNTER — Ambulatory Visit: Payer: 59 | Attending: Internal Medicine

## 2019-09-28 DIAGNOSIS — Z23 Encounter for immunization: Secondary | ICD-10-CM

## 2019-09-28 NOTE — Progress Notes (Signed)
   Covid-19 Vaccination Clinic  Name:  Vanessa Daniels    MRN: [id number] DOB: [birth year]  09/28/2019  [name] was observed post Covid-19 immunization for 15 minutes without incident. She was provided with Vaccine Information Sheet and instruction to access the V-Safe system.   [name] was instructed to call 911 with any severe reactions post vaccine: Marland Kitchen Difficulty breathing  . Swelling of face and throat  . A fast heartbeat  . A bad rash all over body  . Dizziness and weakness   Immunizations Administered    Name Date Dose VIS Date Route   Pfizer COVID-19 Vaccine 09/28/2019  8:54 AM 0.3 mL 08/04/2018 Intramuscular   Manufacturer: [place]   Lot: [id number]   [place]: [id number]

## 2019-11-19 ENCOUNTER — Ambulatory Visit (INDEPENDENT_AMBULATORY_CARE_PROVIDER_SITE_OTHER): Payer: 59 | Admitting: Family Medicine

## 2019-11-19 ENCOUNTER — Other Ambulatory Visit: Payer: Self-pay

## 2019-11-19 ENCOUNTER — Encounter: Payer: Self-pay | Admitting: Family Medicine

## 2019-11-19 VITALS — BP 110/74 | HR 84 | Temp 96.6°F | Ht 64.0 in | Wt 160.2 lb

## 2019-11-19 DIAGNOSIS — Z0000 Encounter for general adult medical examination without abnormal findings: Secondary | ICD-10-CM | POA: Diagnosis not present

## 2019-11-19 DIAGNOSIS — F439 Reaction to severe stress, unspecified: Secondary | ICD-10-CM

## 2019-11-19 DIAGNOSIS — Z8619 Personal history of other infectious and parasitic diseases: Secondary | ICD-10-CM | POA: Diagnosis not present

## 2019-11-19 DIAGNOSIS — D069 Carcinoma in situ of cervix, unspecified: Secondary | ICD-10-CM | POA: Diagnosis not present

## 2019-11-19 DIAGNOSIS — F419 Anxiety disorder, unspecified: Secondary | ICD-10-CM | POA: Diagnosis not present

## 2019-11-19 LAB — POCT URINALYSIS DIP (PROADVANTAGE DEVICE)
Bilirubin, UA: NEGATIVE
Blood, UA: NEGATIVE
Glucose, UA: NEGATIVE mg/dL
Ketones, POC UA: NEGATIVE mg/dL
Leukocytes, UA: NEGATIVE
Nitrite, UA: NEGATIVE
Protein Ur, POC: NEGATIVE mg/dL
Specific Gravity, Urine: 1.015
Urobilinogen, Ur: 0.2
pH, UA: 6 (ref 5.0–8.0)

## 2019-11-19 MED ORDER — VALACYCLOVIR HCL 1 G PO TABS: 2000.0000 mg | ORAL_TABLET | Freq: Two times a day (BID) | ORAL | 5 refills | Status: DC

## 2019-11-19 MED ORDER — ALPRAZOLAM 0.25 MG PO TABS: 0.2500 mg | ORAL_TABLET | Freq: Two times a day (BID) | ORAL | 1 refills | Status: DC | PRN

## 2019-11-19 NOTE — Progress Notes (Signed)
   Subjective:    Patient ID: Vanessa Daniels, female    DOB: 1976/04/04, 44 y.o.   MRN: [id number]  HPI She is here for complete examination.  She does have an underlying history of herpes labialis and would like a refill on her Valtrex.  She also uses Xanax to help with her panic disorder issues revolving being and along the lines at places like [employer] which makes her quite anxious.  She has no difficulty with flying.  She has been under a fair amount of stress dealing with having to quit her job, moving into a new house, getting married in December as well as taking care of her 83 year old son who has behavioral/educational issues.  She does see her gynecologist regularly.  She does have a history of CIN she does not smoke and does not drink.  She is exercising regularly and does have concerns over her weight.  Otherwise family and social history as well as health maintenance and immunizations was reviewed   Review of Systems  All other systems reviewed and are negative.      Objective:   Physical Exam Alert and in no distress. Tympanic membranes and canals are normal. Pharyngeal area is normal. Neck is supple without adenopathy or thyromegaly. Cardiac exam shows a regular sinus rhythm without murmurs or gallops. Lungs are clear to auscultation.        Assessment & Plan:  Routine general medical examination at a health care facility - Plan: Comprehensive metabolic panel, CBC with Differential/Platelet, Lipid panel, POCT Urinalysis DIP (Proadvantage Device)  History of herpes labialis - Plan: valACYclovir (VALTREX) 1000 MG tablet  Carcinoma in situ of cervix, unspecified location  Stress at home  Anxiety - Plan: ALPRAZolam (XANAX) 0.25 MG tablet She seems to be handling her situation fairly well however placement of her son and the school is the biggest issue.  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  I will give a small prescription for Xanax that she will use mainly when flying and when she has to get in lines at airports or other places.  She will continue to be followed by her gynecologist.

## 2019-11-20 LAB — CBC WITH DIFFERENTIAL/PLATELET
Basophils Absolute: 0.1 10*3/uL (ref 0.0–0.2)
Basos: 1 %
EOS (ABSOLUTE): 0.2 10*3/uL (ref 0.0–0.4)
Eos: 2 %
Hematocrit: 40.5 % (ref 34.0–46.6)
Hemoglobin: 13.5 g/dL (ref 11.1–15.9)
Immature Grans (Abs): 0 10*3/uL (ref 0.0–0.1)
Immature Granulocytes: 0 %
Lymphocytes Absolute: 1.8 10*3/uL (ref 0.7–3.1)
Lymphs: 23 %
MCH: 28.8 pg (ref 26.6–33.0)
MCHC: 33.3 g/dL (ref 31.5–35.7)
MCV: 87 fL (ref 79–97)
Monocytes Absolute: 0.6 10*3/uL (ref 0.1–0.9)
Monocytes: 7 %
Neutrophils Absolute: 5.2 10*3/uL (ref 1.4–7.0)
Neutrophils: 67 %
Platelets: 299 10*3/uL (ref 150–450)
RBC: 4.68 x10E6/uL (ref 3.77–5.28)
RDW: 12.3 % (ref 11.7–15.4)
WBC: 7.8 10*3/uL (ref 3.4–10.8)

## 2019-11-20 LAB — COMPREHENSIVE METABOLIC PANEL
ALT: 17 IU/L (ref 0–32)
AST: 22 IU/L (ref 0–40)
Albumin/Globulin Ratio: 2 (ref 1.2–2.2)
Albumin: 4.6 g/dL (ref 3.8–4.8)
Alkaline Phosphatase: 60 IU/L (ref 48–121)
BUN/Creatinine Ratio: 15 (ref 9–23)
BUN: 11 mg/dL (ref 6–24)
Bilirubin Total: 0.3 mg/dL (ref 0.0–1.2)
CO2: 25 mmol/L (ref 20–29)
Calcium: 9.4 mg/dL (ref 8.7–10.2)
Chloride: 102 mmol/L (ref 96–106)
Creatinine, Ser: 0.71 mg/dL (ref 0.57–1.00)
GFR calc Af Amer: 120 mL/min/{1.73_m2} (ref >59)
GFR calc non Af Amer: 104 mL/min/{1.73_m2} (ref >59)
Globulin, Total: 2.3 g/dL (ref 1.5–4.5)
Glucose: 82 mg/dL (ref 65–99)
Potassium: 3.8 mmol/L (ref 3.5–5.2)
Sodium: 139 mmol/L (ref 134–144)
Total Protein: 6.9 g/dL (ref 6.0–8.5)

## 2019-11-20 LAB — LIPID PANEL
Chol/HDL Ratio: 3 ratio (ref 0.0–4.4)
Cholesterol, Total: 188 mg/dL (ref 100–199)
HDL: 63 mg/dL (ref >39)
LDL Chol Calc (NIH): 113 mg/dL — ABNORMAL HIGH (ref 0–99)
Triglycerides: 67 mg/dL (ref 0–149)
VLDL Cholesterol Cal: 12 mg/dL (ref 5–40)

## 2019-11-23 ENCOUNTER — Other Ambulatory Visit: Payer: Self-pay

## 2019-11-23 DIAGNOSIS — L819 Disorder of pigmentation, unspecified: Secondary | ICD-10-CM

## 2019-12-17 ENCOUNTER — Other Ambulatory Visit: Payer: Self-pay

## 2019-12-17 ENCOUNTER — Encounter: Payer: Self-pay | Admitting: Plastic Surgery

## 2019-12-17 ENCOUNTER — Ambulatory Visit (INDEPENDENT_AMBULATORY_CARE_PROVIDER_SITE_OTHER): Payer: Self-pay | Admitting: Plastic Surgery

## 2019-12-17 VITALS — BP 123/79 | HR 108 | Temp 98.6°F

## 2019-12-17 DIAGNOSIS — L821 Other seborrheic keratosis: Secondary | ICD-10-CM | POA: Diagnosis not present

## 2019-12-17 DIAGNOSIS — D225 Melanocytic nevi of trunk: Secondary | ICD-10-CM | POA: Diagnosis not present

## 2019-12-17 DIAGNOSIS — D229 Melanocytic nevi, unspecified: Secondary | ICD-10-CM | POA: Diagnosis not present

## 2019-12-17 DIAGNOSIS — Z411 Encounter for cosmetic surgery: Secondary | ICD-10-CM

## 2019-12-17 DIAGNOSIS — D1801 Hemangioma of skin and subcutaneous tissue: Secondary | ICD-10-CM | POA: Diagnosis not present

## 2019-12-17 NOTE — Progress Notes (Signed)
Patient presents to discuss several issues.  She wants a another treatment of Botox for forehead and glabellar dynamic righted's.  She also wanted to discuss filler for the corners of her mouth.  We discussed the risk and benefits of injectables and she understands.  Face was prepped with an alcohol pad.  30 units of Botox were spread out along the forehead and glabella.  A small injection was placed in the lateral brow area on both sides to try and lift the lateral brow a bit there.  1 cc of Restylane define was split between the bilateral oral commissures and the bilateral nasolabial fold.  This offered a nice correction.  The Restylane define Lot number is [id number].  Expiration is April 09, 2021.

## 2019-12-28 DIAGNOSIS — R87810 Cervical high risk human papillomavirus (HPV) DNA test positive: Secondary | ICD-10-CM | POA: Diagnosis not present

## 2019-12-28 DIAGNOSIS — R87610 Atypical squamous cells of undetermined significance on cytologic smear of cervix (ASC-US): Secondary | ICD-10-CM | POA: Diagnosis not present

## 2019-12-28 DIAGNOSIS — Z1231 Encounter for screening mammogram for malignant neoplasm of breast: Secondary | ICD-10-CM | POA: Diagnosis not present

## 2019-12-28 DIAGNOSIS — Z01419 Encounter for gynecological examination (general) (routine) without abnormal findings: Secondary | ICD-10-CM | POA: Diagnosis not present

## 2019-12-28 LAB — HM PAP SMEAR

## 2019-12-29 LAB — HM MAMMOGRAPHY

## 2020-01-11 ENCOUNTER — Telehealth: Payer: Self-pay | Admitting: Family Medicine

## 2020-01-11 NOTE — Telephone Encounter (Signed)
Received requested records from [facility]

## 2020-01-28 ENCOUNTER — Encounter: Payer: Self-pay | Admitting: Family Medicine

## 2020-02-08 DIAGNOSIS — N870 Mild cervical dysplasia: Secondary | ICD-10-CM | POA: Diagnosis not present

## 2020-02-08 DIAGNOSIS — N841 Polyp of cervix uteri: Secondary | ICD-10-CM | POA: Diagnosis not present

## 2020-02-08 DIAGNOSIS — R87610 Atypical squamous cells of undetermined significance on cytologic smear of cervix (ASC-US): Secondary | ICD-10-CM | POA: Diagnosis not present

## 2020-02-08 DIAGNOSIS — N871 Moderate cervical dysplasia: Secondary | ICD-10-CM | POA: Diagnosis not present

## 2020-02-08 DIAGNOSIS — Z3202 Encounter for pregnancy test, result negative: Secondary | ICD-10-CM | POA: Diagnosis not present

## 2020-02-18 DIAGNOSIS — N871 Moderate cervical dysplasia: Secondary | ICD-10-CM | POA: Diagnosis not present

## 2020-03-03 DIAGNOSIS — Z23 Encounter for immunization: Secondary | ICD-10-CM | POA: Diagnosis not present

## 2020-03-22 ENCOUNTER — Other Ambulatory Visit: Payer: Self-pay

## 2020-03-22 ENCOUNTER — Encounter (HOSPITAL_BASED_OUTPATIENT_CLINIC_OR_DEPARTMENT_OTHER): Payer: Self-pay | Admitting: Obstetrics and Gynecology

## 2020-03-22 NOTE — Progress Notes (Signed)
Spoke w/ via phone for pre-op interview---pt Lab needs dos----     Urine preg t & s          Lab results------none COVID test ------03-28-20 845 Arrive at -------530 am 03-31-20 NPO after MN NO Solid Food.  Clear liquids from MN until---430 am then npo Medications to take morning of surgery -----alprazolam prn Diabetic medication -----n/a Patient Special Instructions -----none Pre-Op special Istructions -----none Patient verbalized understanding of instructions that were given at this phone interview. Patient denies shortness of breath, chest pain, fever, cough at this phone interview.

## 2020-03-28 ENCOUNTER — Other Ambulatory Visit (HOSPITAL_COMMUNITY)
Admission: RE | Admit: 2020-03-28 | Discharge: 2020-03-28 | Disposition: A | Discharge status: Home / Self Care | Payer: 59 | Source: Ambulatory Visit | Attending: Obstetrics and Gynecology | Admitting: Obstetrics and Gynecology

## 2020-03-28 DIAGNOSIS — R42 Dizziness and giddiness: Secondary | ICD-10-CM

## 2020-03-28 DIAGNOSIS — Z20822 Contact with and (suspected) exposure to covid-19: Secondary | ICD-10-CM | POA: Insufficient documentation

## 2020-03-28 DIAGNOSIS — Z01812 Encounter for preprocedural laboratory examination: Secondary | ICD-10-CM | POA: Insufficient documentation

## 2020-03-28 HISTORY — DX: Dizziness and giddiness: R42

## 2020-03-28 LAB — SARS CORONAVIRUS 2 (TAT 6-24 HRS): SARS Coronavirus 2: NEGATIVE

## 2020-03-30 NOTE — Anesthesia Preprocedure Evaluation (Addendum)
Anesthesia Evaluation  Patient identified by MRN, date of birth, ID band Patient awake    Reviewed: Allergy & Precautions, NPO status , Patient's Chart, lab work & pertinent test results  History of Anesthesia Complications Negative for: history of anesthetic complications  Airway Mallampati: I  TM Distance: >3 FB Neck ROM: Full    Dental no notable dental hx.    Pulmonary former smoker,    Pulmonary exam normal breath sounds clear to auscultation       Cardiovascular Exercise Tolerance: Good negative cardio ROS Normal cardiovascular exam Rhythm:Regular Rate:Normal     Neuro/Psych Anxiety Recent episode of vertigo that resolved on its own and has not recurred    GI/Hepatic negative GI ROS, Neg liver ROS,   Endo/Other  negative endocrine ROS  Renal/GU negative Renal ROS  negative genitourinary   Musculoskeletal negative musculoskeletal ROS (+)   Abdominal Normal abdominal exam  (+)   Peds  Hematology   Anesthesia Other Findings   Reproductive/Obstetrics negative OB ROS                           Anesthesia Physical Anesthesia Plan  ASA: I  Anesthesia Plan: MAC   Post-op Pain Management:    Induction:   PONV Risk Score and Plan: 2 and Propofol infusion and TIVA  Airway Management Planned: Natural Airway and Simple Face Mask  Additional Equipment:   Intra-op Plan:   Post-operative Plan:   Informed Consent: I have reviewed the patients History and Physical, chart, labs and discussed the procedure including the risks, benefits and alternatives for the proposed anesthesia with the patient or authorized representative who has indicated his/her understanding and acceptance.       Plan Discussed with: CRNA, Anesthesiologist and Surgeon  Anesthesia Plan Comments: (MAC with propofol gtt. GA/LMA as backup plan. )      Anesthesia Quick Evaluation

## 2020-03-31 ENCOUNTER — Ambulatory Visit (HOSPITAL_BASED_OUTPATIENT_CLINIC_OR_DEPARTMENT_OTHER): Payer: 59 | Admitting: Anesthesiology

## 2020-03-31 ENCOUNTER — Encounter (HOSPITAL_BASED_OUTPATIENT_CLINIC_OR_DEPARTMENT_OTHER)
Admission: RE | Disposition: A | Discharge status: Home / Self Care | Payer: Self-pay | Source: Home / Self Care | Attending: Obstetrics and Gynecology

## 2020-03-31 ENCOUNTER — Other Ambulatory Visit: Payer: Self-pay

## 2020-03-31 ENCOUNTER — Ambulatory Visit (HOSPITAL_BASED_OUTPATIENT_CLINIC_OR_DEPARTMENT_OTHER)
Admission: RE | Admit: 2020-03-31 | Discharge: 2020-03-31 | Disposition: A | Discharge status: Home / Self Care | Payer: 59 | Attending: Obstetrics and Gynecology | Admitting: Obstetrics and Gynecology

## 2020-03-31 ENCOUNTER — Encounter (HOSPITAL_BASED_OUTPATIENT_CLINIC_OR_DEPARTMENT_OTHER): Payer: Self-pay | Admitting: Obstetrics and Gynecology

## 2020-03-31 DIAGNOSIS — Z793 Long term (current) use of hormonal contraceptives: Secondary | ICD-10-CM | POA: Insufficient documentation

## 2020-03-31 DIAGNOSIS — F419 Anxiety disorder, unspecified: Secondary | ICD-10-CM | POA: Diagnosis not present

## 2020-03-31 DIAGNOSIS — M419 Scoliosis, unspecified: Secondary | ICD-10-CM | POA: Insufficient documentation

## 2020-03-31 DIAGNOSIS — D649 Anemia, unspecified: Secondary | ICD-10-CM | POA: Insufficient documentation

## 2020-03-31 DIAGNOSIS — N871 Moderate cervical dysplasia: Secondary | ICD-10-CM | POA: Insufficient documentation

## 2020-03-31 DIAGNOSIS — Z87891 Personal history of nicotine dependence: Secondary | ICD-10-CM | POA: Diagnosis not present

## 2020-03-31 DIAGNOSIS — Z79899 Other long term (current) drug therapy: Secondary | ICD-10-CM | POA: Insufficient documentation

## 2020-03-31 DIAGNOSIS — N870 Mild cervical dysplasia: Secondary | ICD-10-CM | POA: Diagnosis not present

## 2020-03-31 DIAGNOSIS — Z880 Allergy status to penicillin: Secondary | ICD-10-CM | POA: Insufficient documentation

## 2020-03-31 DIAGNOSIS — Z881 Allergy status to other antibiotic agents status: Secondary | ICD-10-CM | POA: Insufficient documentation

## 2020-03-31 HISTORY — DX: Anemia, unspecified: D64.9

## 2020-03-31 HISTORY — PX: CERVICAL CONIZATION W/BX: SHX1330

## 2020-03-31 LAB — ABO/RH: ABO/RH(D): O POS

## 2020-03-31 LAB — TYPE AND SCREEN
ABO/RH(D): O POS
Antibody Screen: NEGATIVE

## 2020-03-31 LAB — POCT PREGNANCY, URINE: Preg Test, Ur: NEGATIVE

## 2020-03-31 SURGERY — CONE BIOPSY, CERVIX
Anesthesia: Monitor Anesthesia Care | Site: Vagina

## 2020-03-31 MED ORDER — FENTANYL CITRATE (PF) 100 MCG/2ML IJ SOLN
INTRAMUSCULAR | Status: AC
  Filled 2020-03-31: qty 2

## 2020-03-31 MED ORDER — DEXMEDETOMIDINE (PRECEDEX) IN NS 20 MCG/5ML (4 MCG/ML) IV SYRINGE
PREFILLED_SYRINGE | INTRAVENOUS | Status: AC
  Filled 2020-03-31: qty 5

## 2020-03-31 MED ORDER — 0.9 % SODIUM CHLORIDE (POUR BTL) OPTIME
TOPICAL | Status: DC | PRN
  Administered 2020-03-31: 500 mL

## 2020-03-31 MED ORDER — DEXMEDETOMIDINE (PRECEDEX) IN NS 20 MCG/5ML (4 MCG/ML) IV SYRINGE
PREFILLED_SYRINGE | INTRAVENOUS | Status: DC | PRN
  Administered 2020-03-31 (×2): 4 ug via INTRAVENOUS

## 2020-03-31 MED ORDER — MIDAZOLAM HCL 2 MG/2ML IJ SOLN
INTRAMUSCULAR | Status: AC
  Filled 2020-03-31: qty 2

## 2020-03-31 MED ORDER — MIDAZOLAM HCL 2 MG/2ML IJ SOLN
INTRAMUSCULAR | Status: DC | PRN
  Administered 2020-03-31: 1 mg via INTRAVENOUS

## 2020-03-31 MED ORDER — LIDOCAINE 2% (20 MG/ML) 5 ML SYRINGE
INTRAMUSCULAR | Status: DC | PRN
  Administered 2020-03-31: 50 mg via INTRAVENOUS

## 2020-03-31 MED ORDER — OXYCODONE HCL 5 MG PO TABS
ORAL_TABLET | ORAL | Status: AC
  Filled 2020-03-31: qty 1

## 2020-03-31 MED ORDER — OXYCODONE HCL 5 MG/5ML PO SOLN: 5.0000 mg | Freq: Once | ORAL | Status: AC | PRN

## 2020-03-31 MED ORDER — ONDANSETRON HCL 4 MG/2ML IJ SOLN
INTRAMUSCULAR | Status: AC
  Filled 2020-03-31: qty 2

## 2020-03-31 MED ORDER — AMISULPRIDE (ANTIEMETIC) 5 MG/2ML IV SOLN: 10.0000 mg | Freq: Once | INTRAVENOUS | Status: DC | PRN

## 2020-03-31 MED ORDER — FERRIC SUBSULFATE SOLN
Status: DC | PRN
  Administered 2020-03-31: 1

## 2020-03-31 MED ORDER — FENTANYL CITRATE (PF) 100 MCG/2ML IJ SOLN
25.0000 ug | INTRAMUSCULAR | Status: DC | PRN
  Administered 2020-03-31 (×3): 25 ug via INTRAVENOUS

## 2020-03-31 MED ORDER — PROPOFOL 500 MG/50ML IV EMUL
INTRAVENOUS | Status: DC | PRN
  Administered 2020-03-31: 200 ug/kg/min via INTRAVENOUS

## 2020-03-31 MED ORDER — OXYCODONE HCL 5 MG PO TABS
5.0000 mg | ORAL_TABLET | Freq: Once | ORAL | Status: AC | PRN
  Administered 2020-03-31: 5 mg via ORAL

## 2020-03-31 MED ORDER — PROPOFOL 10 MG/ML IV BOLUS
INTRAVENOUS | Status: DC | PRN
  Administered 2020-03-31: 50 mg via INTRAVENOUS

## 2020-03-31 MED ORDER — DEXAMETHASONE SODIUM PHOSPHATE 10 MG/ML IJ SOLN
INTRAMUSCULAR | Status: AC
  Filled 2020-03-31: qty 1

## 2020-03-31 MED ORDER — FENTANYL CITRATE (PF) 100 MCG/2ML IJ SOLN
INTRAMUSCULAR | Status: DC | PRN
  Administered 2020-03-31 (×2): 25 ug via INTRAVENOUS

## 2020-03-31 MED ORDER — KETOROLAC TROMETHAMINE 30 MG/ML IJ SOLN
INTRAMUSCULAR | Status: AC
  Filled 2020-03-31: qty 1

## 2020-03-31 MED ORDER — SODIUM CHLORIDE FLUSH 0.9 % IV SOLN
INTRAVENOUS | Status: DC | PRN
  Administered 2020-03-31: 100 mL via INTRAVENOUS

## 2020-03-31 MED ORDER — VASOPRESSIN 20 UNIT/ML IV SOLN
INTRAVENOUS | Status: DC | PRN
  Administered 2020-03-31: 20 [IU]

## 2020-03-31 MED ORDER — KETOROLAC TROMETHAMINE 30 MG/ML IJ SOLN
30.0000 mg | Freq: Once | INTRAMUSCULAR | Status: AC
  Administered 2020-03-31: 30 mg via INTRAVENOUS

## 2020-03-31 MED ORDER — LACTATED RINGERS IV SOLN
INTRAVENOUS | Status: DC
  Administered 2020-03-31: 1000 mL via INTRAVENOUS

## 2020-03-31 MED ORDER — PROPOFOL 500 MG/50ML IV EMUL
INTRAVENOUS | Status: AC
  Filled 2020-03-31: qty 50

## 2020-03-31 MED ORDER — LIDOCAINE 2% (20 MG/ML) 5 ML SYRINGE
INTRAMUSCULAR | Status: AC
  Filled 2020-03-31: qty 5

## 2020-03-31 MED ORDER — PROMETHAZINE HCL 25 MG/ML IJ SOLN: 6.2500 mg | INTRAMUSCULAR | Status: DC | PRN

## 2020-03-31 MED ORDER — PROPOFOL 10 MG/ML IV BOLUS
INTRAVENOUS | Status: AC
  Filled 2020-03-31: qty 20

## 2020-03-31 SURGICAL SUPPLY — 25 items
APL SWBSTK 6 STRL LF DISP (MISCELLANEOUS) ×1
APPLICATOR COTTON TIP 6 STRL (MISCELLANEOUS) ×1 IMPLANT
APPLICATOR COTTON TIP 6IN STRL (MISCELLANEOUS) ×2
BLADE SURG 11 STRL SS (BLADE) ×2 IMPLANT
CATH ROBINSON RED A/P 16FR (CATHETERS) ×2 IMPLANT
COVER WAND RF STERILE (DRAPES) ×2 IMPLANT
ELECT BALL LEEP 5MM RED (ELECTRODE) ×2 IMPLANT
GLOVE BIOGEL PI IND STRL 6.5 (GLOVE) ×2 IMPLANT
GLOVE BIOGEL PI IND STRL 7.0 (GLOVE) ×1 IMPLANT
GLOVE BIOGEL PI INDICATOR 6.5 (GLOVE) ×2
GLOVE BIOGEL PI INDICATOR 7.0 (GLOVE) ×1
GLOVE ECLIPSE 6.5 STRL STRAW (GLOVE) ×2 IMPLANT
GOWN STRL REUS W/ TWL LRG LVL3 (GOWN DISPOSABLE) ×1 IMPLANT
GOWN STRL REUS W/TWL LRG LVL3 (GOWN DISPOSABLE) ×2
HIBICLENS CHG 4% 4OZ (MISCELLANEOUS) ×2 IMPLANT
NEEDLE SPNL 18GX3.5 QUINCKE PK (NEEDLE) ×2 IMPLANT
NS IRRIG 1000ML POUR BTL (IV SOLUTION) ×2 IMPLANT
PACK VAGINAL WOMENS (CUSTOM PROCEDURE TRAY) ×2 IMPLANT
PAD OB MATERNITY 4.3X12.25 (PERSONAL CARE ITEMS) ×2 IMPLANT
PENCIL SMOKE EVACUATOR (MISCELLANEOUS) ×2 IMPLANT
SCOPETTES 8  STERILE (MISCELLANEOUS) ×8
SCOPETTES 8 STERILE (MISCELLANEOUS) ×4 IMPLANT
SUT SILK 2 0 SH (SUTURE) ×2 IMPLANT
SUT VICRYL 0 UR6 27IN ABS (SUTURE) ×4 IMPLANT
TOWEL OR 17X26 10 PK STRL BLUE (TOWEL DISPOSABLE) ×2 IMPLANT

## 2020-03-31 NOTE — Transfer of Care (Signed)
Immediate Anesthesia Transfer of Care Note  Patient: Vanessa Daniels  Procedure(s) Performed: Procedure(s) (LRB): CONIZATION CERVIX WITH BIOPSY with incidental IUD removal (N/A)  Patient Location: PACU  Anesthesia Type: MAC  Level of Consciousness: awake, alert , oriented and patient cooperative  Airway & Oxygen Therapy: Patient Spontanous Breathing and Patient connected to face mask oxygen  Post-op Assessment: Report given to PACU RN and Post -op Vital signs reviewed and stable  Post vital signs: Reviewed and stable  Complications: No apparent anesthesia complications  Last Vitals:  Vitals Value Taken Time  BP 121/58 03/31/20 0838  Temp 36.5 C 03/31/20 0838  Pulse 98 03/31/20 0839  Resp 22 03/31/20 0839  SpO2 100 % 03/31/20 0839  Vitals shown include unvalidated device data.  Last Pain:  Vitals:   03/31/20 0642  TempSrc: Oral  PainSc: 0-No pain      Patients Stated Pain Goal: 8 (16/10/96 0454)  Complications: No complications documented.

## 2020-03-31 NOTE — Anesthesia Procedure Notes (Signed)
Procedure Name: MAC Date/Time: 03/31/2020 7:30 AM Performed by: Suan Halter, CRNA Pre-anesthesia Checklist: Patient identified, Emergency Drugs available, Suction available, Patient being monitored and Timeout performed Patient Re-evaluated:Patient Re-evaluated prior to induction Oxygen Delivery Method: Simple face mask

## 2020-03-31 NOTE — Anesthesia Postprocedure Evaluation (Signed)
Anesthesia Post Note  Patient: Vanessa Daniels  Procedure(s) Performed: CONIZATION CERVIX WITH BIOPSY with incidental IUD removal (N/A Vagina )     Patient location during evaluation: PACU Anesthesia Type: MAC Level of consciousness: awake and alert Pain management: pain level controlled Vital Signs Assessment: post-procedure vital signs reviewed and stable Respiratory status: spontaneous breathing and respiratory function stable Cardiovascular status: stable Postop Assessment: no apparent nausea or vomiting Anesthetic complications: no   No complications documented.  Last Vitals:  Vitals:   03/31/20 0900 03/31/20 0915  BP: 125/76 (!) 119/54  Pulse: 84 75  Resp: 20 17  Temp:    SpO2: 100% 100%                   Merlinda Frederick

## 2020-03-31 NOTE — Op Note (Addendum)
03/31/2020  8:30 AM  PATIENT:  Vanessa Daniels  44 y.o. female  PRE-OPERATIVE DIAGNOSIS:  Moderate cervical dysplasia CIN2- recurrent POST-OPERATIVE DIAGNOSIS:  Moderate cervical dysplasia- CIN2- recurrent  PROCEDURE:  Procedure(s) with comments: CONIZATION CERVIX WITH BIOPSY with incidental IUD removal (N/A) - COLD KNIFE CONE  SURGEON:  Surgeon(s) and Role:    * Koty Anctil, DO - Primary   ANESTHESIA:   MAC  EBL:  50 mL   LOCAL MEDICATIONS USED:  OTHER vasopressin 10cc diluted in saline  SPECIMEN:  Source of Specimen:  portio of cervix  DISPOSITION OF SPECIMEN:  PATHOLOGY   DESCRIPTION OF PROCEDURE: Patient was taken to OR where anesthesia was administered and found to be adequate.  Patient was prepped and draped in normal sterile fashion in dorsal lithotomy position.  A speculum was placed and anterior lip of cervix was grasped with single tooth tenaculum.  10cc of diluate vasopressin was injected around face of cervix and stay sutures were placed at 3 and 9 o'clock with 2-0 vicryl.  The area of excision was demarcated with a marker and 11blade scalpel was used to make angled incision to depth of approx 2cm and circumferentially continued until entire cone specimen was freed.  IUD strings remained intact, but as specimen was removed, IUD came out with it. Bovie cautery was used to control small bleeders, additional pressure was applied and once good hemostasis noted, Monsel's solution was applied with a fox swab.  No further bleeding noted.  Stay sutures were clipped short but left in place to dissolve.  All instruments were removed.    Patient tolerated the procedure well.  COUNTS:  YES  PLAN OF CARE: Discharge to home after PACU  PATIENT DISPOSITION:  PACU - hemodynamically stable.   Delay start of Pharmacological VTE agent (>24hrs) due to surgical blood loss or risk of bleeding: not applicable

## 2020-03-31 NOTE — OR Nursing (Signed)
IUD inadvertently removed with cervical biopsy by Dr. Rogue Bussing

## 2020-03-31 NOTE — Discharge Instructions (Signed)
  Post Anesthesia Home Care Instructions  Activity: Get plenty of rest for the remainder of the day. A responsible individual must stay with you for 24 hours following the procedure.  For the next 24 hours, DO NOT: -Drive a car -Paediatric nurse -Drink alcoholic beverages -Take any medication unless instructed by your physician -Make any legal decisions or sign important papers.  Meals: Start with liquid foods such as gelatin or soup. Progress to regular foods as tolerated. Avoid greasy, spicy, heavy foods. If nausea and/or vomiting occur, drink only clear liquids until the nausea and/or vomiting subsides. Call your physician if vomiting continues.  Special Instructions/Symptoms: Your throat may feel dry or sore from the anesthesia or the breathing tube placed in your throat during surgery. If this causes discomfort, gargle with warm salt water. The discomfort should disappear within 24 hours.    May take Ibuprofen, Advil, Motrin, or Aleve at 3 PM as needed for pain.  The following instructions have been prepared to help you care for yourself upon your return home today.  Personal hygiene: . Shower the day after your procedure.  Activity and limitations: . Do NOT drive or operate any equipment today. . Do NOT lift anything more than 15 pounds for 2-3 weeks after surgery. . Do NOT rest in bed all day. . Walking is encouraged. Walk each day, starting slowly with 5-minute walks 3 or 4 times a day. Slowly increase the length of your walks. . Walk up and down stairs slowly. . Do NOT do strenuous activities, such as golfing, playing tennis, bowling, running, biking, weight lifting, gardening, mowing, or vacuuming for 2-4 weeks. Ask your doctor when it is okay to start.  Diet: Eat a light meal as desired this evening. You may resume your usual diet tomorrow.  Return to work: This is dependent on the type of work you do. For the most part you can return to a desk job within a week of  surgery. If you are more active at work, please discuss this with your doctor.  What to expect after your surgery: You may have a slight burning sensation when you urinate on the first day. You may have a very small amount of blood in the urine. Expect to have a small amount of vaginal discharge/light bleeding for 1-2 weeks. It is not unusual to have abdominal soreness and bruising for up to 2 weeks. You may be tired and need more rest for about 1 week. You may experience shoulder pain for 24-72 hours. Lying flat in bed may relieve it.  Call your doctor for any of the following: . Develop a fever of 100.4 or greater . Inability to urinate 6 hours after discharge from hospital . Severe pain not relieved by pain medications . Persistent of heavy bleeding at incision site . Redness or swelling around incision site after a week . Increasing nausea or vomiting  Pads only for vaginal drainage, nothing in your vagina until you see Dr. Rogue Bussing.DISCHARGE INSTRUCTIONS: Laparoscopyy

## 2020-03-31 NOTE — H&P (Addendum)
44 y.o. [id number] presents for scheduled CKC.  Historical record shows LEEP performed 2005 for CIN3 with pos endocervical margin, subsequent paps on file include LSIL +HPV with colpo CIN1 in 2017. Thereafter pap neg but HPV+. Given h.o leep and persisent HPV: colpo- 02/02/2019: normal appearance, ECC only collected, path benign. 2021: ASCUS HPV+ colpo again: CIN 1 and CIN2@9oc  and ECC benign polyp.   Past Medical History:  Diagnosis Date  . Anemia yrs ago  . Anxiety   . CIN 3 - cervical intraepithelial neoplasia grade 3   . Scoliosis    MILD DDD AND MILD SCOLIOSIS  . Vertigo 03/28/2020   got really dizzy and anti nausea med helped   Past Surgical History:  Procedure Laterality Date  . COLONOSCOPY  last done 8 yrs ago  . LEEP  05   J. MATTHEWS   Breast implants 2008  Social History   Socioeconomic History  . Marital status: Single    Spouse name: Not on file  . Number of children: Not on file  . Years of education: Not on file  . Highest education level: Not on file  Occupational History  . Not on file  Tobacco Use  . Smoking status: Former Smoker    Packs/day: 0.25    Years: 16.00    Pack years: 4.00    Types: Cigarettes    Quit date: 06/10/2016    Years since quitting: 3.8  . Smokeless tobacco: Never Used  Vaping Use  . Vaping Use: Never used  Substance and Sexual Activity  . Alcohol use: Not Currently    Alcohol/week: 0.0 standard drinks    Comment: rare  . Drug use: No  . Sexual activity: Yes  Other Topics Concern  . Not on file  Social History Narrative   Works in Nurse, children's.   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 Difficulty of Paying Living Expenses: Not on file  Food Insecurity:   . Worried About Charity fundraiser in the Last Year: Not on file  . Ran Out of Food in the Last Year: Not on file  Transportation Needs:   . Lack of Transportation (Medical): Not on file  . Lack of Transportation (Non-Medical):  Not on file  Physical Activity:   . Days of Exercise per Week: Not on file  . Minutes of Exercise per Session: Not on file  Stress:   . Feeling of Stress : Not on file  Social Connections:   . Frequency of Communication with Friends and Family: Not on file  . Frequency of Social Gatherings with Friends and Family: Not on file  . Attends Religious Services: Not on file  . Active Member of Clubs or Organizations: Not on file  . Attends Archivist Meetings: Not on file  . Marital Status: Not on file  Intimate Partner Violence:   . Fear of Current or Ex-Partner: Not on file  . Emotionally Abused: Not on file  . Physically Abused: Not on file  . Sexually Abused: Not on file    No current facility-administered medications on file prior to encounter.   Current Outpatient Medications on File Prior to Encounter  Medication Sig Dispense Refill  . levonorgestrel (MIRENA) 20 MCG/24HR IUD 1 each by Intrauterine route once. Inserted 2017    . OVER THE COUNTER MEDICATION daily. Little critters omega 3. 2 gummies    . OVER THE COUNTER MEDICATION daily. Litter  critters immune    . OVER THE COUNTER MEDICATION 100 mg daily. Little critters calcium.    Marland Kitchen ALPRAZolam (XANAX) 0.25 MG tablet Take 1 tablet (0.25 mg total) by mouth 2 (two) times daily as needed for anxiety. 20 tablet 1  . hydrocortisone cream (PREPARATION H) 1 % Apply 1 application topically as needed for itching.     . valACYclovir (VALTREX) 1000 MG tablet Take 2 tablets (2,000 mg total) by mouth 2 (two) times daily. For one day for breakout 8 tablet 5    Allergies  Allergen Reactions  . Augmentin [Amoxicillin-Pot Clavulanate] Nausea And Vomiting    Vitals:   03/22/20 0939 03/31/20 0642  BP:  121/69  Pulse:  78  Resp:  14  Temp:  98 F (36.7 C)  TempSrc:  Oral  SpO2:  99%  Weight: 67.1 kg 67.5 kg  Height: 5' 4"  (1.626 m) 5' 4"  (1.626 m)   From office: Lungs: clear to ascultation Cor:  RRR Abdomen:  soft,  nontender, nondistended. Ex:  no cords, erythema Pelvic: deferred to OR  A:  Admit for scheduled Cold Knife [facility] for CIN 2 with history of LEEP   P:  All risks, benefits and alternatives d/w patient and she desires to proceed.   SCDs during the operation.   Option of LEEP vs CKC vs hysterectomy. Routine preop care No abx needed  Allyn Kenner

## 2020-04-03 ENCOUNTER — Encounter (HOSPITAL_BASED_OUTPATIENT_CLINIC_OR_DEPARTMENT_OTHER): Payer: Self-pay | Admitting: Obstetrics and Gynecology

## 2020-04-04 LAB — SURGICAL PATHOLOGY

## 2020-04-20 ENCOUNTER — Encounter: Payer: Self-pay | Admitting: Plastic Surgery

## 2020-04-20 ENCOUNTER — Other Ambulatory Visit: Payer: Self-pay

## 2020-04-20 ENCOUNTER — Ambulatory Visit (INDEPENDENT_AMBULATORY_CARE_PROVIDER_SITE_OTHER): Payer: Self-pay | Admitting: Plastic Surgery

## 2020-04-20 VITALS — BP 129/65 | HR 105 | Temp 98.3°F

## 2020-04-20 DIAGNOSIS — Z411 Encounter for cosmetic surgery: Secondary | ICD-10-CM

## 2020-04-20 NOTE — Progress Notes (Signed)
Patient is here about 4 months out from her last visit.  She is interested in more Botox.  She has a wedding coming up in 3 weeks.  At her last visit we did 30 units of Botox.  Particularly along the forehead and glabella and tried to lift in the lateral brow a bit.  We also did some Restylane define the nasolabial folds in the lateral or oral commissures which she said helped but not quite to the level that she had hoped.  We discussed injection of Botox again and plan to use about the same amount as last time.  We discussed the risks and benefits.  The forehead, glabella and crows feet were prepped with an alcohol pad.  30 units of Botox were distributed throughout.  I did 1 small lateral brow injection and 1 small crows feet injection on both sides and the rest was distributed across the forehead and glabella.  I tried not to go too far laterally to give her a little bit of accentuation of the brow arch that she is interested in.  I have asked her to make a 2-week touchup visit which she can always cancel with everything looks good.

## 2020-04-26 DIAGNOSIS — Z3202 Encounter for pregnancy test, result negative: Secondary | ICD-10-CM | POA: Diagnosis not present

## 2020-04-26 DIAGNOSIS — Z30430 Encounter for insertion of intrauterine contraceptive device: Secondary | ICD-10-CM | POA: Diagnosis not present

## 2020-04-27 ENCOUNTER — Encounter: Payer: Self-pay | Admitting: Plastic Surgery

## 2020-05-03 ENCOUNTER — Encounter: Payer: Self-pay | Admitting: Plastic Surgery

## 2020-05-16 DIAGNOSIS — Z30431 Encounter for routine checking of intrauterine contraceptive device: Secondary | ICD-10-CM | POA: Diagnosis not present

## 2020-05-17 ENCOUNTER — Telehealth: Payer: Self-pay | Admitting: Family Medicine

## 2020-05-17 MED ORDER — METHYLPREDNISOLONE 4 MG PO TBPK: ORAL_TABLET | ORAL | 0 refills | Status: DC

## 2020-05-17 NOTE — Telephone Encounter (Signed)
She has had a few days of runny nose, sinus congestion and dry cough. No fever.

## 2020-06-20 ENCOUNTER — Other Ambulatory Visit: Payer: Self-pay | Admitting: Physician Assistant

## 2020-06-20 DIAGNOSIS — Z8619 Personal history of other infectious and parasitic diseases: Secondary | ICD-10-CM

## 2020-06-20 MED ORDER — VALACYCLOVIR HCL 1 G PO TABS: 2000.0000 mg | ORAL_TABLET | Freq: Two times a day (BID) | ORAL | 1 refills | Status: DC

## 2020-06-21 MED FILL — valACYclovir HCL 1 GM TABS: 1 | 8 days supply | Qty: 30 | Fill #0

## 2020-07-04 ENCOUNTER — Other Ambulatory Visit: Payer: 59

## 2020-07-04 DIAGNOSIS — Z20822 Contact with and (suspected) exposure to covid-19: Secondary | ICD-10-CM | POA: Diagnosis not present

## 2020-07-06 LAB — NOVEL CORONAVIRUS, NAA: SARS-CoV-2, NAA: NOT DETECTED

## 2020-07-06 LAB — SARS-COV-2, NAA 2 DAY TAT

## 2020-10-16 ENCOUNTER — Telehealth: Payer: Self-pay | Admitting: Family Medicine

## 2020-10-16 DIAGNOSIS — F419 Anxiety disorder, unspecified: Secondary | ICD-10-CM

## 2020-10-16 MED ORDER — ALPRAZOLAM 0.25 MG PO TABS: 0.2500 mg | ORAL_TABLET | Freq: Two times a day (BID) | ORAL | 1 refills | Status: DC | PRN

## 2020-10-16 NOTE — Telephone Encounter (Signed)
Pt called and is going out of the country and would like to know if she can get a refill on her xanax to help her with the flight  Pt uses [facility], [place] - [address]

## 2020-10-23 DIAGNOSIS — Z20822 Contact with and (suspected) exposure to covid-19: Secondary | ICD-10-CM | POA: Diagnosis not present

## 2020-10-26 DIAGNOSIS — H5213 Myopia, bilateral: Secondary | ICD-10-CM | POA: Diagnosis not present

## 2020-11-07 ENCOUNTER — Telehealth: Payer: Self-pay | Admitting: Family Medicine

## 2020-11-07 NOTE — Telephone Encounter (Signed)
Done

## 2020-12-01 ENCOUNTER — Encounter: Payer: 59 | Admitting: Family Medicine

## 2020-12-04 ENCOUNTER — Ambulatory Visit (INDEPENDENT_AMBULATORY_CARE_PROVIDER_SITE_OTHER): Payer: 59 | Admitting: Family Medicine

## 2020-12-04 ENCOUNTER — Encounter: Payer: Self-pay | Admitting: Family Medicine

## 2020-12-04 ENCOUNTER — Other Ambulatory Visit: Payer: Self-pay

## 2020-12-04 VITALS — BP 124/82 | HR 81 | Temp 98.2°F | Ht 64.0 in | Wt 155.6 lb

## 2020-12-04 DIAGNOSIS — D069 Carcinoma in situ of cervix, unspecified: Secondary | ICD-10-CM

## 2020-12-04 DIAGNOSIS — Z1159 Encounter for screening for other viral diseases: Secondary | ICD-10-CM

## 2020-12-04 DIAGNOSIS — F988 Other specified behavioral and emotional disorders with onset usually occurring in childhood and adolescence: Secondary | ICD-10-CM | POA: Diagnosis not present

## 2020-12-04 DIAGNOSIS — Z1211 Encounter for screening for malignant neoplasm of colon: Secondary | ICD-10-CM | POA: Diagnosis not present

## 2020-12-04 DIAGNOSIS — Z0000 Encounter for general adult medical examination without abnormal findings: Secondary | ICD-10-CM | POA: Diagnosis not present

## 2020-12-04 DIAGNOSIS — Z8619 Personal history of other infectious and parasitic diseases: Secondary | ICD-10-CM

## 2020-12-04 DIAGNOSIS — F419 Anxiety disorder, unspecified: Secondary | ICD-10-CM

## 2020-12-04 MED ORDER — AMPHETAMINE-DEXTROAMPHETAMINE 10 MG PO TABS: 10.0000 mg | ORAL_TABLET | Freq: Every day | ORAL | 0 refills | Status: DC

## 2020-12-04 NOTE — Progress Notes (Signed)
   Subjective:    Patient ID: Vanessa Daniels, female    DOB: 07-23-1975, 45 y.o.   MRN: [id number]  HPI She is here for complete examination.  Her main complaint today is of a feeling of brain fog.  She also notes slight decrease in energy as well as being forgetful.  She does have history of headache but this went away.  No blurred vision, double vision, weakness, numbness, tingling.  She did have COVID but apparently the symptoms actually started before she had COVID.  Further questioning indicates that she has probably had ADD type symptoms for a long period of time but never paid much attention to it.  Her son has ADD and her mother has ADD.  Some of the symptoms she says did go back as far as high school.  She has had difficulty with anxiety and does occasionally use Xanax.  She does have some claustrophobia symptoms but seems to be handling this fairly well.  She does have CIN and does see gynecology for this.  She has had a mammogram.  She was married in December and just got back from honeymoon.  She does have a young son who is having difficulty with school as well as his ADD and as part of the autism spectrum disorder.  Otherwise her family and social history as well as health maintenance and immunizations was reviewed.   Review of Systems  All other systems reviewed and are negative.     Objective:   Physical Exam Alert and in no distress. Tympanic membranes and canals are normal. Pharyngeal area is normal. Neck is supple without adenopathy or thyromegaly. Cardiac exam shows a regular sinus rhythm without murmurs or gallops. Lungs are clear to auscultation. ADD questionnaire given which does show evidence of ADD but not ADHD.       Assessment & Plan:  Routine general medical examination at a health care facility - Plan: Comprehensive metabolic panel, CBC with Differential/Platelet, Lipid panel  History of herpes labialis  Carcinoma in situ of cervix, unspecified  location  Anxiety  Screening for colon cancer - Plan: Cologuard  Need for hepatitis C screening test - Plan: Hepatitis C antibody  Attention deficit disorder (ADD) without hyperactivity - Plan: amphetamine-dextroamphetamine (ADDERALL) 10 MG tablet Discussed the diagnosis of ADD with her.  I think it is worthwhile trying her on this.  If we are successful, we can continue this.  If there is any question we can always get further evaluation and she was comfortable with that.  She is to let me know if it works, how long it works and is she having any trouble. Discussed the anxiety with her and the possible relation to ADD versus also possibly getting involved in counseling to learn how to handle anxiety better.  She will continue be followed by gynecology.

## 2020-12-04 NOTE — Patient Instructions (Signed)
Try the Adderall and let me know if it works, how long it works and already having any trouble with it

## 2020-12-05 LAB — LIPID PANEL
Chol/HDL Ratio: 2.7 ratio (ref 0.0–4.4)
Cholesterol, Total: 210 mg/dL — ABNORMAL HIGH (ref 100–199)
HDL: 77 mg/dL (ref >39)
LDL Chol Calc (NIH): 122 mg/dL — ABNORMAL HIGH (ref 0–99)
Triglycerides: 64 mg/dL (ref 0–149)
VLDL Cholesterol Cal: 11 mg/dL (ref 5–40)

## 2020-12-05 LAB — COMPREHENSIVE METABOLIC PANEL
ALT: 22 IU/L (ref 0–32)
AST: 20 IU/L (ref 0–40)
Albumin/Globulin Ratio: 1.8 (ref 1.2–2.2)
Albumin: 4.7 g/dL (ref 3.8–4.8)
Alkaline Phosphatase: 63 IU/L (ref 44–121)
BUN/Creatinine Ratio: 12 (ref 9–23)
BUN: 10 mg/dL (ref 6–24)
Bilirubin Total: 0.4 mg/dL (ref 0.0–1.2)
CO2: 23 mmol/L (ref 20–29)
Calcium: 9.8 mg/dL (ref 8.7–10.2)
Chloride: 99 mmol/L (ref 96–106)
Creatinine, Ser: 0.81 mg/dL (ref 0.57–1.00)
Globulin, Total: 2.6 g/dL (ref 1.5–4.5)
Glucose: 83 mg/dL (ref 65–99)
Potassium: 4.1 mmol/L (ref 3.5–5.2)
Sodium: 137 mmol/L (ref 134–144)
Total Protein: 7.3 g/dL (ref 6.0–8.5)
eGFR: 91 mL/min/{1.73_m2} (ref >59)

## 2020-12-05 LAB — CBC WITH DIFFERENTIAL/PLATELET
Basophils Absolute: 0.1 10*3/uL (ref 0.0–0.2)
Basos: 1 %
EOS (ABSOLUTE): 0.1 10*3/uL (ref 0.0–0.4)
Eos: 2 %
Hematocrit: 42.8 % (ref 34.0–46.6)
Hemoglobin: 14.7 g/dL (ref 11.1–15.9)
Immature Grans (Abs): 0 10*3/uL (ref 0.0–0.1)
Immature Granulocytes: 0 %
Lymphocytes Absolute: 2 10*3/uL (ref 0.7–3.1)
Lymphs: 25 %
MCH: 29.1 pg (ref 26.6–33.0)
MCHC: 34.3 g/dL (ref 31.5–35.7)
MCV: 85 fL (ref 79–97)
Monocytes Absolute: 0.5 10*3/uL (ref 0.1–0.9)
Monocytes: 7 %
Neutrophils Absolute: 5.1 10*3/uL (ref 1.4–7.0)
Neutrophils: 65 %
Platelets: 349 10*3/uL (ref 150–450)
RBC: 5.05 x10E6/uL (ref 3.77–5.28)
RDW: 11.9 % (ref 11.7–15.4)
WBC: 7.8 10*3/uL (ref 3.4–10.8)

## 2020-12-05 LAB — HEPATITIS C ANTIBODY: Hep C Virus Ab: 0.1 s/co ratio (ref 0.0–0.9)

## 2020-12-05 NOTE — Progress Notes (Signed)
The blood work is normal

## 2020-12-06 ENCOUNTER — Encounter: Payer: 59 | Admitting: Family Medicine

## 2020-12-08 ENCOUNTER — Encounter: Payer: 59 | Admitting: Family Medicine

## 2020-12-12 ENCOUNTER — Encounter: Payer: 59 | Admitting: Family Medicine

## 2020-12-16 DIAGNOSIS — Z1211 Encounter for screening for malignant neoplasm of colon: Secondary | ICD-10-CM | POA: Diagnosis not present

## 2020-12-18 LAB — COLOGUARD: Cologuard: NEGATIVE

## 2020-12-21 LAB — COLOGUARD: COLOGUARD: NEGATIVE

## 2020-12-27 NOTE — Progress Notes (Signed)
Pt was called to advise of negative cologuard results . [facility]

## 2021-01-05 DIAGNOSIS — Z124 Encounter for screening for malignant neoplasm of cervix: Secondary | ICD-10-CM | POA: Diagnosis not present

## 2021-01-05 DIAGNOSIS — Z01419 Encounter for gynecological examination (general) (routine) without abnormal findings: Secondary | ICD-10-CM | POA: Diagnosis not present

## 2021-01-05 LAB — RESULTS CONSOLE HPV: CHL HPV: NEGATIVE

## 2021-01-05 LAB — HM PAP SMEAR: HM Pap smear: NEGATIVE

## 2021-02-07 NOTE — Progress Notes (Signed)
Subjective:    CC: R ankle pain  I, Vanessa Daniels, LAT, ATC, am serving as scribe for Dr. Lynne Leader.  HPI: Pt is a 45 y/o female presenting w/ R ankle pain ongoing since 8/29.  MOI: Pt reports her dog got under her feet when going down the stairs and she fell down the last 2 steps, rolling her ankle into INV. She locates her pain to the lateral aspect of R ankle and along the 5th MT.  R ankle swelling: yes- w/ ecchymosis Treatments tried: IBU, compression wrap and sock  Pertinent review of Systems: No fevers or chills  Relevant historical information: CIN-3   Objective:    Vitals:   02/08/21 0833  BP: 124/82  Pulse: (!) 101  SpO2: 100%   General: Well Developed, well nourished, and in no acute distress.   MSK: Right foot and ankle Swelling most dominant lateral ankle and lateral midfoot. Some bruising across lateral ankle and dorsal lateral foot. Decreased motion foot and ankle with tenderness. Pulses cap refill and sensation are intact distally. Tender to palpation lateral malleolus and proximal fifth metatarsal. Knee is nontender forefoot is nontender medial ankle is nontender. Ligament stability exam not performed due to pain and guarding.  Lab and Radiology Results  X-ray images right foot and ankle obtained today personally and independently interpreted  Right ankle: No acute fractures.  No significant degenerative changes.  Right foot: No acute fractures.  No significant degenerative changes.  Await formal radiology review  Diagnostic Limited MSK Ultrasound of: Right ankle Lateral ankle significant soft tissue edema overlying bony structures. Peroneal tendon intact with hypoechoic fluid tracking and tendon sheath consistent with tenosynovitis or occult partial tear. No full-thickness retracted peroneal tendon tear. Impression: Peroneal tenosynovitis.  Soft tissue edema    Impression and Recommendations:    Assessment and Plan: 45 y.o. female  with right lateral ankle sprain.  Evidence of peroneal tenosynovitis or partial tear on ultrasound.  Plan for cam walker boot for comfort and transition into ASO ankle brace or compression ankle sleeve as tolerated.  Refer to PT.  Recheck back in 2 to 4 weeks.Marland Kitchen  PDMP not reviewed this encounter. Orders Placed This Encounter  Procedures   DG Ankle Complete Right    Standing Status:   Future    Number of Occurrences:   1    Standing Expiration Date:   02/08/2022    Order Specific Question:   Reason for Exam (SYMPTOM  OR DIAGNOSIS REQUIRED)    Answer:   right ankle pain    Order Specific Question:   Preferred imaging location?    Answer:   [facility]    Order Specific Question:   Is patient pregnant?    Answer:   No   DG Foot Complete Right    Standing Status:   Future    Number of Occurrences:   1    Standing Expiration Date:   02/08/2022    Order Specific Question:   Reason for Exam (SYMPTOM  OR DIAGNOSIS REQUIRED)    Answer:   right foot pain    Order Specific Question:   Preferred imaging location?    Answer:   [facility]    Order Specific Question:   Is patient pregnant?    Answer:   No   [place] LIMITED JOINT SPACE STRUCTURES LOW RIGHT(NO LINKED CHARGES)    Order Specific Question:   Reason for Exam (SYMPTOM  OR DIAGNOSIS REQUIRED)  Answer:   R ankle pain    Order Specific Question:   Preferred imaging location?    Answer:   [facility]   Ambulatory referral to Physical Therapy    Referral Priority:   Routine    Referral Type:   Physical Medicine    Referral Reason:   Specialty Services Required    Requested Specialty:   Physical Therapy    Number of Visits Requested:   1   No orders of the defined types were placed in this encounter.   Discussed warning signs or symptoms. Please see discharge instructions. Patient expresses understanding.   The above documentation has been reviewed and is accurate and complete Lynne Leader, M.D.

## 2021-02-08 ENCOUNTER — Ambulatory Visit: Payer: Self-pay

## 2021-02-08 ENCOUNTER — Ambulatory Visit (INDEPENDENT_AMBULATORY_CARE_PROVIDER_SITE_OTHER): Payer: 59 | Admitting: Family Medicine

## 2021-02-08 ENCOUNTER — Other Ambulatory Visit: Payer: Self-pay

## 2021-02-08 ENCOUNTER — Ambulatory Visit (INDEPENDENT_AMBULATORY_CARE_PROVIDER_SITE_OTHER): Payer: 59

## 2021-02-08 ENCOUNTER — Encounter: Payer: Self-pay | Admitting: Family Medicine

## 2021-02-08 VITALS — BP 124/82 | HR 101 | Ht 64.0 in | Wt 156.4 lb

## 2021-02-08 DIAGNOSIS — M79671 Pain in right foot: Secondary | ICD-10-CM | POA: Diagnosis not present

## 2021-02-08 DIAGNOSIS — M25571 Pain in right ankle and joints of right foot: Secondary | ICD-10-CM

## 2021-02-08 DIAGNOSIS — S93401A Sprain of unspecified ligament of right ankle, initial encounter: Secondary | ICD-10-CM | POA: Diagnosis not present

## 2021-02-08 DIAGNOSIS — S9001XA Contusion of right ankle, initial encounter: Secondary | ICD-10-CM | POA: Diagnosis not present

## 2021-02-08 NOTE — Patient Instructions (Signed)
Thank you for coming in today.   I've referred you to Physical Therapy.  Let us know if you don't hear from them in one week.   Work on range of motion   Ibuprofen and tylenol is ok.   Please go to [facility] supply to get the Cam Walker boot and Ankle brace we talked about today. You may also be able to get it from [employer].     I recommend you obtained a compression sleeve to help with your joint problems. There are many options on the market however I recommend obtaining a full ankle Body Helix compression sleeve.  You can find information (including how to appropriate measure yourself for sizing) can be found at www.Body http://www.lambert.com/.  Many of these products are health savings account (HSA) eligible.   You can use the compression sleeve at any time throughout the day but is most important to use while being active as well as for 2 hours post-activity.   It is appropriate to ice following activity with the compression sleeve in place.    Recheck in 2-4 weeks unless you feel ok.   Ankle Sprain, Phase I Rehab An ankle sprain is an injury to the ligaments of your ankle. Ankle sprains cause stiffness, loss of motion, and loss of strength. Ask your health care provider which exercises are safe for you. Do exercises exactly as told by your health care provider and adjust them as directed. It is normal to feel mild stretching, pulling, tightness, or discomfort as you do these exercises. Stop right away if you feel sudden pain or your pain gets worse. Do not begin these exercises until told by your health care provider. Stretching and range-of-motion exercises These exercises warm up your muscles and joints and improve the movement and flexibility of your lower leg and ankle. These exercises also help to relieve pain and stiffness. Gastroc and soleus stretch This exercise is also called a calf stretch. It stretches the muscles in the back of the lower leg. These muscles are the gastrocnemius, or  gastroc, and the soleus. Sit on the floor with your left / right leg extended. Loop a belt or towel around the ball of your left / right foot. The ball of your foot is on the walking surface, right under your toes. Keep your left / right ankle and foot relaxed and keep your knee straight while you use the belt or towel to pull your foot toward you. You should feel a gentle stretch behind your calf or knee in your gastroc muscle. Hold this position for __________ seconds, then release to the starting position. Repeat the exercise with your knee bent. You can put a pillow or a rolled bath towel under your knee to support it. You should feel a stretch deep in your calf in the soleus muscle or at your Achilles tendon. Repeat __________ times. Complete this exercise __________ times a day. Ankle alphabet  Sit with your left / right leg supported at the lower leg. Do not rest your foot on anything. Make sure your foot has room to move freely. Think of your left / right foot as a paintbrush. Move your foot to trace each letter of the alphabet in the air. Keep your hip and knee still while you trace. Make the letters as large as you can without feeling discomfort. Trace every letter from A to Z. Repeat __________ times. Complete this exercise __________ times a day. Strengthening exercises These exercises build strength and endurance  in your ankle and lower leg. Endurance is the ability to use your muscles for a long time, even after they get tired. Ankle dorsiflexion  Secure a rubber exercise band or tube to an object, such as a table leg, that will stay still when the band is pulled. Secure the other end around your left / right foot. Sit on the floor facing the object, with your left / right leg extended. The band or tube should be slightly tense when your foot is relaxed. Slowly bring your foot toward you, bringing the top of your foot toward your shin (dorsiflexion), and pulling the band  tighter. Hold this position for __________ seconds. Slowly return your foot to the starting position. Repeat __________ times. Complete this exercise __________ times a day. Ankle plantar flexion  Sit on the floor with your left / right leg extended. Loop a rubber exercise tube or band around the ball of your left / right foot. The ball of your foot is on the walking surface, right under your toes. Hold the ends of the band or tube in your hands. The band or tube should be slightly tense when your foot is relaxed. Slowly point your foot and toes downward to tilt the top of your foot away from your shin (plantar flexion). Hold this position for __________ seconds. Slowly return your foot to the starting position. Repeat __________ times. Complete this exercise __________ times a day. Ankle eversion Sit on the floor with your legs straight out in front of you. Loop a rubber exercise band or tube around the ball of your left / right foot. The ball of your foot is on the walking surface, right under your toes. Hold the ends of the band in your hands, or secure the band to a stable object. The band or tube should be slightly tense when your foot is relaxed. Slowly push your foot outward, away from your other leg (eversion). Hold this position for __________ seconds. Slowly return your foot to the starting position. Repeat __________ times. Complete this exercise __________ times a day. This information is not intended to replace advice given to you by your health care provider. Make sure you discuss any questions you have with your health care provider. Document Revised: 07/20/2020 Document Reviewed: 07/20/2020 Elsevier Patient Education  2022 [employer].

## 2021-02-13 NOTE — Progress Notes (Signed)
Right ankle x-ray looks normal to radiology

## 2021-02-13 NOTE — Progress Notes (Signed)
Right foot x-ray looks normal to radiology

## 2021-02-22 ENCOUNTER — Ambulatory Visit: Payer: 59 | Admitting: Family Medicine

## 2021-03-14 ENCOUNTER — Other Ambulatory Visit: Payer: Self-pay | Admitting: Obstetrics and Gynecology

## 2021-03-14 DIAGNOSIS — Z1231 Encounter for screening mammogram for malignant neoplasm of breast: Secondary | ICD-10-CM

## 2021-04-17 ENCOUNTER — Ambulatory Visit
Admission: RE | Admit: 2021-04-17 | Discharge: 2021-04-17 | Disposition: A | Discharge status: Home / Self Care | Payer: 59 | Source: Ambulatory Visit | Attending: Obstetrics and Gynecology | Admitting: Obstetrics and Gynecology

## 2021-04-17 ENCOUNTER — Other Ambulatory Visit: Payer: Self-pay

## 2021-04-17 DIAGNOSIS — Z1231 Encounter for screening mammogram for malignant neoplasm of breast: Secondary | ICD-10-CM

## 2021-04-17 LAB — HM MAMMOGRAPHY

## 2021-08-29 ENCOUNTER — Other Ambulatory Visit: Payer: Self-pay | Admitting: Physician Assistant

## 2021-08-29 MED ORDER — AMOXICILLIN 500 MG PO CAPS: 500.0000 mg | ORAL_CAPSULE | Freq: Two times a day (BID) | ORAL | 0 refills | Status: AC

## 2021-11-22 ENCOUNTER — Ambulatory Visit (INDEPENDENT_AMBULATORY_CARE_PROVIDER_SITE_OTHER): Payer: Self-pay | Admitting: Plastic Surgery

## 2021-11-22 DIAGNOSIS — Z411 Encounter for cosmetic surgery: Secondary | ICD-10-CM

## 2021-11-22 NOTE — Progress Notes (Signed)
Referring Provider Denita Lung, MD [address] [place],  [place] [id number]   CC:  Chief Complaint  Patient presents with   Botulinum Toxin Injection      Vanessa Daniels is an 46 y.o. female.  HPI: Patient presents for a number of concerns.  She would like to talk about revision breast surgery.  She had a breast augmentation in 2008 with subpectoral saline implants.  She went from a B cup to a D cup.  She feels that the implants have fallen slightly inferior and lateral as time is passed and she is interested in being smaller and perkier.  She does not recall the volume of implants that were placed through an inframammary approach.  She is up-to-date on her mammograms.  She is also interested in discussing Botox injection.  We did 30 units last time in the forehead, glabella and lateral brow crows feet area.  She liked the result of this.    Allergies  Allergen Reactions   Augmentin [Amoxicillin-Pot Clavulanate] Nausea And Vomiting    Outpatient Encounter Medications as of 11/22/2021  Medication Sig   ALPRAZolam (XANAX) 0.25 MG tablet Take 1 tablet (0.25 mg total) by mouth 2 (two) times daily as needed for anxiety.   hydrocortisone cream 1 % Apply 1 application topically as needed for itching.    levonorgestrel (MIRENA, 52 MG,) 20 MCG/DAY IUD Mirena 20 mcg/24 hours (7 yrs) 52 mg intrauterine device  Take 1 device by intrauterine route.   methylPREDNISolone (MEDROL DOSEPAK) 4 MG TBPK tablet As directed   OVER THE COUNTER MEDICATION daily. Little critters omega 3. 2 gummies   [DISCONTINUED] amphetamine-dextroamphetamine (ADDERALL) 10 MG tablet Take 1 tablet (10 mg total) by mouth daily with breakfast.   [DISCONTINUED] OVER THE COUNTER MEDICATION daily. Litter critters immune   [DISCONTINUED] OVER THE COUNTER MEDICATION 100 mg daily. Little critters calcium.   No facility-administered encounter medications on file as of 11/22/2021.     Past Medical History:   Diagnosis Date   Anemia yrs ago   Anxiety    CIN 3 - cervical intraepithelial neoplasia grade 3    Scoliosis    MILD DDD AND MILD SCOLIOSIS   Vertigo 03/28/2020   got really dizzy and anti nausea med helped    Past Surgical History:  Procedure Laterality Date   AUGMENTATION MAMMAPLASTY Bilateral 2008   CERVICAL CONIZATION W/BX N/A 03/31/2020   Procedure: CONIZATION CERVIX WITH BIOPSY with incidental IUD removal;  Surgeon: Allyn Kenner, DO;  Location: [facility];  Service: Gynecology;  Laterality: N/A;  COLD KNIFE CONE   COLONOSCOPY  last done 8 yrs ago   LEEP  2005   J. MATTHEWS    Family History  Problem Relation Age of Onset   Diabetes Father    Colon cancer Paternal Grandfather    Breast cancer Neg Hx     Social History   Social History Narrative   Works in Nurse, children's.   Has a 12 year old son     Review of Systems General: Denies fevers, chills, weight loss CV: Denies chest pain, shortness of breath, palpitations  Physical Exam    02/08/2021    8:33 AM 12/04/2020    3:11 PM 04/20/2020   12:38 PM  Vitals with BMI  Height 5' 4"  5' 4"    Weight 156 lbs 6 oz 155 lbs 10 oz   BMI 18.56 31.4   Systolic 970 263 785  Diastolic 82 82 65  Pulse 885  81 105    General:  No acute distress,  Alert and oriented, Non-Toxic, Normal speech and affect Breast: She has mild ptosis.  Symmetric result from subpectoral saline implants.  She also points out some fullness in the upper back/lateral axillary area which seems separate and unrelated to the breast augmentation.  Well-healed inframammary scar.  Symmetric nipple and areola position.  Soft capsules.  12 cm base width.  Assessment/Plan Patient would be reasonably good candidate for revision augmentation mastopexy with liposuction of the upper back.  I explained she would need to be prone and liposuction could be done of the upper back and posterior axillary area.  She would then be flipped supine where any  further liposuction could be performed followed by replacement of her implants with a smaller size and she would require a mastopexy to tighten the skin envelope.  I explained that this would likely involve periareolar, vertical and inframammary scars to get the best shape and contour.  We discussed risks include bleeding, infection, damage to surrounding structures need for additional procedures.  We would need to discuss again how much smaller she would like to be than her current volume as we do not know exactly how much volume she has in.  I would expect that the volume is somewhere around 300 to 350 cc currently.  She would like to stay with saline implants.  We reviewed the risks and benefits of Botox treatment and she is interested in moving forward.  The forehead, glabella and crows feet were prepped with an alcohol pad and 30 units of Botox were drawn up.  I did 1 injection in the lateral brow area and 1 injection in the lateral crows feet at the level of the lateral canthus.  I then did 2 lines for the forehead and the remainder in the glabella.  She tolerated this fine.  We will plan to provide a quote for her for the revision breast surgery.  Cindra Presume 11/22/2021, 11:53 AM

## 2021-12-05 ENCOUNTER — Encounter: Payer: Self-pay | Admitting: *Deleted

## 2021-12-13 ENCOUNTER — Other Ambulatory Visit (HOSPITAL_COMMUNITY): Payer: Self-pay

## 2021-12-13 ENCOUNTER — Ambulatory Visit (INDEPENDENT_AMBULATORY_CARE_PROVIDER_SITE_OTHER): Payer: 59 | Admitting: Family Medicine

## 2021-12-13 ENCOUNTER — Encounter: Payer: Self-pay | Admitting: Family Medicine

## 2021-12-13 ENCOUNTER — Encounter (HOSPITAL_COMMUNITY): Payer: Self-pay

## 2021-12-13 VITALS — BP 110/72 | HR 78 | Temp 96.6°F | Ht 64.0 in | Wt 154.2 lb

## 2021-12-13 DIAGNOSIS — Z0000 Encounter for general adult medical examination without abnormal findings: Secondary | ICD-10-CM

## 2021-12-13 DIAGNOSIS — F419 Anxiety disorder, unspecified: Secondary | ICD-10-CM

## 2021-12-13 DIAGNOSIS — Z23 Encounter for immunization: Secondary | ICD-10-CM

## 2021-12-13 DIAGNOSIS — E663 Overweight: Secondary | ICD-10-CM

## 2021-12-13 DIAGNOSIS — F988 Other specified behavioral and emotional disorders with onset usually occurring in childhood and adolescence: Secondary | ICD-10-CM

## 2021-12-13 DIAGNOSIS — D069 Carcinoma in situ of cervix, unspecified: Secondary | ICD-10-CM

## 2021-12-13 MED ORDER — WEGOVY 0.25 MG/0.5ML ~~LOC~~ SOAJ
0.2500 mg | SUBCUTANEOUS | 0 refills | Status: DC
  Filled 2021-12-13: qty 2, 28d supply, fill #0

## 2021-12-13 NOTE — Patient Instructions (Signed)

## 2021-12-13 NOTE — Progress Notes (Signed)
Complete physical exam  Patient: Vanessa Daniels   DOB: 06-27-1975   46 y.o. Female  MRN: [id number]  Subjective:    Chief Complaint  Patient presents with   Annual Exam    Fasting     Vanessa Daniels is a 46 y.o. female who presents today for a complete physical exam. She reports consuming a general diet. The patient does not participate in regular exercise at present. She generally feels fairly well. She reports sleeping well.  She is concerned over her weight.  She states that she has had to go to a 1 size higher set of clothing.  She would like some help with this and is concerned about getting on Wegovy to help with this.  She does have underlying anxiety usually revolving around claustrophobia as well as fear of flying.  She would like to continue on Xanax on an as-needed basis.  She does have carcinoma in situ of the cervix and gets regular gynecologic follow-up concerning this.  She does have ADD and did try an ADD med however when it wore off she did have unacceptable side effects.  She does have a son who has special needs and she seems to be handling this fairly well.  Otherwise her family and social history as well as health maintenance and immunizations was reviewed.  Her marriage is going quite well.  Most recent fall risk assessment:     No data to display           Most recent depression screenings:    12/13/2021    8:43 AM 12/04/2020    3:05 PM  PHQ 2/9 Scores  PHQ - 2 Score 0 0      Patient Active Problem List   Diagnosis Date Noted   History of herpes labialis 11/19/2019   Anxiety 11/19/2019   CIN III (cervical intraepithelial neoplasia III) 04/07/2012   Past Medical History:  Diagnosis Date   Anemia yrs ago   Anxiety    CIN 3 - cervical intraepithelial neoplasia grade 3    Scoliosis    MILD DDD AND MILD SCOLIOSIS   Vertigo 03/28/2020   got really dizzy and anti nausea med helped   Past Surgical History:  Procedure Laterality Date    AUGMENTATION MAMMAPLASTY Bilateral 2008   CERVICAL CONIZATION W/BX N/A 03/31/2020   Procedure: CONIZATION CERVIX WITH BIOPSY with incidental IUD removal;  Surgeon: Allyn Kenner, DO;  Location: [facility];  Service: Gynecology;  Laterality: N/A;  COLD KNIFE CONE   COLONOSCOPY  last done 8 yrs ago   LEEP  2005   [facility]. MATTHEWS   Social History   Tobacco Use   Smoking status: Former    Packs/day: 0.25    Years: 16.00    Total pack years: 4.00    Types: Cigarettes    Quit date: 06/10/2016    Years since quitting: 5.5   Smokeless tobacco: Never  Vaping Use   Vaping Use: Never used  Substance Use Topics   Alcohol use: Not Currently    Alcohol/week: 0.0 standard drinks of alcohol    Comment: rare   Drug use: No   Family History  Problem Relation Age of Onset   Diabetes Father    Colon cancer Paternal Grandfather    Breast cancer Neg Hx    Allergies  Allergen Reactions   Augmentin [Amoxicillin-Pot Clavulanate] Nausea And Vomiting      Patient Care Team: Denita Lung, MD as PCP -  General (Family Medicine)   Outpatient Medications Prior to Visit  Medication Sig Note   ALPRAZolam (XANAX) 0.25 MG tablet Take 1 tablet (0.25 mg total) by mouth 2 (two) times daily as needed for anxiety. 12/13/2021: Prn last dose was yesterday   levonorgestrel (MIRENA, 52 MG,) 20 MCG/DAY IUD Mirena 20 mcg/24 hours (7 yrs) 52 mg intrauterine device  Take 1 device by intrauterine route.    OVER THE COUNTER MEDICATION daily. Little critters omega 3. 2 gummies    hydrocortisone cream 1 % Apply 1 application topically as needed for itching.  (Patient not taking: Reported on 12/13/2021)    methylPREDNISolone (MEDROL DOSEPAK) 4 MG TBPK tablet As directed (Patient not taking: Reported on 12/13/2021)    valACYclovir (VALTREX) 1000 MG tablet TAKE 2 TABLETS BY MOUTH 2 TIMES DAILY FOR 1 DAY FOR BREAKOUT. (Patient not taking: Reported on 12/13/2021) 12/13/2021: Prn last dose two months ago   No  facility-administered medications prior to visit.    Review of Systems  All other systems reviewed and are negative.         Objective:  Alert and in no distress. Tympanic membranes and canals are normal. Pharyngeal area is normal. Neck is supple without adenopathy or thyromegaly. Cardiac exam shows a regular sinus rhythm without murmurs or gallops. Lungs are clear to auscultation.    BP 110/72   Pulse 78   Temp (!) 96.6 F (35.9 C)   Ht '5\' 4"'  (1.626 m)   Wt 154 lb 3.2 oz (69.9 kg)   SpO2 100%   BMI 26.47 kg/m  BP Readings from Last 3 Encounters:  12/13/21 110/72  02/08/21 124/82  12/04/20 124/82   Wt Readings from Last 3 Encounters:  12/13/21 154 lb 3.2 oz (69.9 kg)  02/08/21 156 lb 6.4 oz (70.9 kg)  12/04/20 155 lb 9.6 oz (70.6 kg)      Physical Exam   Last CBC Lab Results  Component Value Date   WBC 7.8 12/04/2020   HGB 14.7 12/04/2020   HCT 42.8 12/04/2020   MCV 85 12/04/2020   MCH 29.1 12/04/2020   RDW 11.9 12/04/2020   PLT 349 37/48/2707   Last metabolic panel Lab Results  Component Value Date   GLUCOSE 83 12/04/2020   NA 137 12/04/2020   K 4.1 12/04/2020   CL 99 12/04/2020   CO2 23 12/04/2020   BUN 10 12/04/2020   CREATININE 0.81 12/04/2020   EGFR 91 12/04/2020   CALCIUM 9.8 12/04/2020   PROT 7.3 12/04/2020   ALBUMIN 4.7 12/04/2020   LABGLOB 2.6 12/04/2020   AGRATIO 1.8 12/04/2020   BILITOT 0.4 12/04/2020   ALKPHOS 63 12/04/2020   AST 20 12/04/2020   ALT 22 12/04/2020   Last lipids Lab Results  Component Value Date   CHOL 210 (H) 12/04/2020   HDL 77 12/04/2020   LDLCALC 122 (H) 12/04/2020   TRIG 64 12/04/2020   CHOLHDL 2.7 12/04/2020        Assessment & Plan:   Routine general medical examination at a health care facility - Plan: CBC with Differential/Platelet, Comprehensive metabolic panel, Lipid panel  Carcinoma in situ of cervix, unspecified location  Attention deficit disorder (ADD) without hyperactivity  Anxiety -  Claustrophobia.  Fear of flying.  Overweight (BMI 25.0-29.9) - Plan: Semaglutide-Weight Management (WEGOVY) 0.25 MG/0.5ML SOAJ  Need for Tdap vaccination - Plan: Tdap vaccine greater than or equal to 7yo IM  Immunization History  Administered Date(s) Administered   DT (Pediatric) 11/17/1992, 03/13/2004  DTaP 08/09/1975, 09/09/1975, 11/09/1975, 02/08/1977   IPV 07/12/1975, 09/09/1975, 11/09/1975, 02/08/1977   Influenza Inj Mdck Quad Pf 03/16/2017   Influenza Inj Mdck Quad With Preservative 03/11/2019   Influenza Split 05/23/2011, 04/07/2012, 06/03/2015   Influenza,inj,Quad PF,6+ Mos 03/06/2016, 03/03/2020   Influenza-Unspecified 03/10/2016, 03/01/2018, 03/10/2018, 02/13/2019, 02/09/2020, 03/28/2021   MMR 10/08/1976, 12/05/1992   PFIZER(Purple Top)SARS-COV-2 Vaccination 09/03/2019, 09/28/2019, 05/05/2020, 10/16/2020   Pfizer Covid-19 Vaccine Bivalent Booster 50yr & up 03/28/2021   Tdap 04/07/2012    Health Maintenance  Topic Date Due   HIV Screening  12/14/2022 (Originally 05/03/1990)   INFLUENZA VACCINE  01/08/2022   TETANUS/TDAP  04/07/2022   PAP SMEAR-Modifier  12/28/2022   Fecal DNA (Cologuard)  12/19/2023   COVID-19 Vaccine  Completed   Hepatitis C Screening  Completed   HPV VACCINES  Aged Out    Discussed health benefits of physical activity, and encouraged her to engage in regular exercise appropriate for her age and condition.  I also discussed weight management in regard to cutting back on carbohydrates.  Specifically mentioning cutting back on bread, rice, pasta, potatoes and sugar.  I discussed the use of Wegovy with her explained that this is a more long-term type of regimen and since she is only overweight and has no other mitigating criteria, I encouraged other options but [facility]  She is to call me in 1 month to let me know how she is doing on this medicine.  Explained that I will probably want to see her back in 2 months I then discussed  the use of a different ADD medication that would last longer and have less likelihood of having withdrawal type symptoms.  She will keep me informed as to whether she wants to try that or not. She will continue to be followed by GYN.  Continue Xanax as needed  Problem List Items Addressed This Visit     Anxiety   Other Visit Diagnoses     Routine general medical examination at a health care facility    -  Primary   Relevant Orders   CBC with Differential/Platelet   Comprehensive metabolic panel   Lipid panel   Carcinoma in situ of cervix, unspecified location       Attention deficit disorder (ADD) without hyperactivity       Overweight (BMI 25.0-29.9)       Relevant Medications   Semaglutide-Weight Management (WEGOVY) 0.25 MG/0.5ML SOAJ   Need for Tdap vaccination       Relevant Orders   Tdap vaccine greater than or equal to 7yo IM     Recheck here in 2 months to discuss Wegovy and weight her. Return in about 1 year (around 12/14/2022) for fasting cpe .     JJill Alexanders MD

## 2021-12-14 ENCOUNTER — Telehealth: Payer: Self-pay | Admitting: Internal Medicine

## 2021-12-14 LAB — CBC WITH DIFFERENTIAL/PLATELET
Basophils Absolute: 0.1 10*3/uL (ref 0.0–0.2)
Basos: 1 %
EOS (ABSOLUTE): 0.2 10*3/uL (ref 0.0–0.4)
Eos: 3 %
Hematocrit: 43.9 % (ref 34.0–46.6)
Hemoglobin: 14.8 g/dL (ref 11.1–15.9)
Immature Grans (Abs): 0 10*3/uL (ref 0.0–0.1)
Immature Granulocytes: 0 %
Lymphocytes Absolute: 1.4 10*3/uL (ref 0.7–3.1)
Lymphs: 20 %
MCH: 29.4 pg (ref 26.6–33.0)
MCHC: 33.7 g/dL (ref 31.5–35.7)
MCV: 87 fL (ref 79–97)
Monocytes Absolute: 0.5 10*3/uL (ref 0.1–0.9)
Monocytes: 8 %
Neutrophils Absolute: 4.7 10*3/uL (ref 1.4–7.0)
Neutrophils: 68 %
Platelets: 352 10*3/uL (ref 150–450)
RBC: 5.04 x10E6/uL (ref 3.77–5.28)
RDW: 12.2 % (ref 11.7–15.4)
WBC: 6.8 10*3/uL (ref 3.4–10.8)

## 2021-12-14 LAB — COMPREHENSIVE METABOLIC PANEL
ALT: 23 IU/L (ref 0–32)
AST: 23 IU/L (ref 0–40)
Albumin/Globulin Ratio: 1.6 (ref 1.2–2.2)
Albumin: 4.4 g/dL (ref 3.8–4.8)
Alkaline Phosphatase: 57 IU/L (ref 44–121)
BUN/Creatinine Ratio: 15 (ref 9–23)
BUN: 12 mg/dL (ref 6–24)
Bilirubin Total: 0.4 mg/dL (ref 0.0–1.2)
CO2: 24 mmol/L (ref 20–29)
Calcium: 9.3 mg/dL (ref 8.7–10.2)
Chloride: 104 mmol/L (ref 96–106)
Creatinine, Ser: 0.82 mg/dL (ref 0.57–1.00)
Globulin, Total: 2.7 g/dL (ref 1.5–4.5)
Glucose: 90 mg/dL (ref 70–99)
Potassium: 4.6 mmol/L (ref 3.5–5.2)
Sodium: 142 mmol/L (ref 134–144)
Total Protein: 7.1 g/dL (ref 6.0–8.5)
eGFR: 89 mL/min/{1.73_m2} (ref >59)

## 2021-12-14 LAB — LIPID PANEL
Chol/HDL Ratio: 2.9 ratio (ref 0.0–4.4)
Cholesterol, Total: 185 mg/dL (ref 100–199)
HDL: 64 mg/dL (ref >39)
LDL Chol Calc (NIH): 106 mg/dL — ABNORMAL HIGH (ref 0–99)
Triglycerides: 81 mg/dL (ref 0–149)
VLDL Cholesterol Cal: 15 mg/dL (ref 5–40)

## 2021-12-14 NOTE — Telephone Encounter (Signed)
PA started for Wegovy.

## 2021-12-17 ENCOUNTER — Telehealth: Payer: Self-pay

## 2021-12-17 NOTE — Telephone Encounter (Signed)
Called [facility] to request an urgent appt for patient. I spoke with Mel in triage, she stated that she was going to contact the patient for more details b/c after a certain period of time the hemorrhoid is no longer thrombosed. I provided Mel with the patient's contact information.

## 2021-12-17 NOTE — Telephone Encounter (Signed)
I just spoke to a triage nurse at [facility] and they have an opening tomorrow afternoon.  They will call this patient before the end of the day.  Thanks for working on that.  HD

## 2021-12-17 NOTE — Telephone Encounter (Signed)
Vanessa Daniels, this is a patient of mine I saw for a thrombosed external hemorrhoid 3 years ago.  She called me having the same problem again.  I referred her to [facility] in 2020, but their notes were not on epic at that time so I cannot recall who she saw, and I do not find any of their notes in the media section.  I would appreciate you calling [facility] this morning to try and get Vanessa Daniels into their office today or tomorrow even if it is with the PA because she has what sounds like a recurrence of thrombosed external hemorrhoid.

## 2021-12-17 NOTE — Telephone Encounter (Signed)
Noted, thanks!

## 2021-12-17 NOTE — Telephone Encounter (Signed)
Called [facility] to follow up on call from earlier today. I spoke with Sharyn Lull in the nursing office, I was told that no appt had been made for the patient. I asked if Mel was able to speak with the patient and I let them know that I wanted to know if a message was sent to a provider or what the plan was for the patient. I have provided Sharyn Lull with my direct office #, she stated that she was going to check with Mel and let me know. Will await return call from [facility].  Called and spoke with patient to follow up and see if she talked with anyone from [facility]. Pt stated that she had not heard anything. Pt reports that she has had the thrombosed hemorrhoid since last Tuesday. She has constant rectal pain (6-7/10), denies any bleeding. She reports that it is very uncomfortable. I told her that I am waiting on a call back from [facility] and I will give them this information and hopefully we can get her scheduled soon.

## 2021-12-18 NOTE — Telephone Encounter (Signed)
Your request for prior authorization was denied, as pt does not have BMI of 27 with 1 weight- related comorbidity or just BMI of more than 30. Please advise  Message from plan: This request has not been approved. Based on the information submitted for review, you did not meet our guideline rules for the requested drug. In order for your request to be approved, your provider would need to show that you have met the guideline rules below.. For approval of Wegovy for adult patients needing weight loss or weight management, our guideline named ANTI-OBESITY AGENTS requires ALL the following for approval:A) Your doctor provides evidence that you are actively enrolled in an exercise and caloric reduction program or a weight loss/behavioral modification program. B) You meet one of the following: a) Body mass index (BMI) of 30 kg/m(2) or greater. b) BMI of 27 kg/m(2) or greater and at least one weight-related comorbidity (disease) such as hypertension (high blood pressure), type 2 diabetes mellitus, or hyperlipidemia (high cholesterol). T 

## 2021-12-18 NOTE — Telephone Encounter (Signed)
Pt was notified.  

## 2021-12-20 DIAGNOSIS — K645 Perianal venous thrombosis: Secondary | ICD-10-CM | POA: Diagnosis not present

## 2022-01-16 DIAGNOSIS — H5213 Myopia, bilateral: Secondary | ICD-10-CM | POA: Diagnosis not present

## 2022-01-17 ENCOUNTER — Other Ambulatory Visit (HOSPITAL_BASED_OUTPATIENT_CLINIC_OR_DEPARTMENT_OTHER): Payer: Self-pay

## 2022-01-24 DIAGNOSIS — Z124 Encounter for screening for malignant neoplasm of cervix: Secondary | ICD-10-CM | POA: Diagnosis not present

## 2022-01-24 DIAGNOSIS — Z01419 Encounter for gynecological examination (general) (routine) without abnormal findings: Secondary | ICD-10-CM | POA: Diagnosis not present

## 2022-01-25 ENCOUNTER — Other Ambulatory Visit (HOSPITAL_COMMUNITY): Payer: Self-pay

## 2022-02-01 ENCOUNTER — Other Ambulatory Visit (HOSPITAL_COMMUNITY): Payer: Self-pay

## 2022-02-13 ENCOUNTER — Encounter: Payer: Self-pay | Admitting: Internal Medicine

## 2022-04-01 ENCOUNTER — Encounter: Payer: Self-pay | Admitting: Internal Medicine

## 2022-04-15 NOTE — Telephone Encounter (Signed)
Phoned patient advised quote is in her mychart from June - if she is still interested - we would need to let dr taylor do a no charge consult - she will read chart and let us know

## 2022-05-13 ENCOUNTER — Other Ambulatory Visit (HOSPITAL_BASED_OUTPATIENT_CLINIC_OR_DEPARTMENT_OTHER): Payer: Self-pay

## 2022-05-13 ENCOUNTER — Encounter: Payer: Self-pay | Admitting: Family Medicine

## 2022-05-13 ENCOUNTER — Telehealth: Payer: 59 | Admitting: Family Medicine

## 2022-05-13 DIAGNOSIS — F988 Other specified behavioral and emotional disorders with onset usually occurring in childhood and adolescence: Secondary | ICD-10-CM

## 2022-05-13 MED ORDER — LISDEXAMFETAMINE DIMESYLATE 20 MG PO CAPS
20.0000 mg | ORAL_CAPSULE | Freq: Every day | ORAL | 0 refills | Status: DC
  Filled 2022-05-13: qty 30, 30d supply, fill #0

## 2022-05-13 NOTE — Progress Notes (Signed)
   Subjective:    Patient ID: Vanessa Daniels, female    DOB: 1976-05-11, 46 y.o.   MRN: [id number]  HPI Documentation for virtual audio and video telecommunications through [place] encounter: The patient was located at home. 2 patient identifiers used.  The provider was located in the office. The patient did consent to this visit and is aware of possible charges through their insurance for this visit. The other persons participating in this telemedicine service were none. Time spent on call was 5 minutes and in review of previous records >25 minutes total for counseling and coordination of care. This virtual service is not related to other E/M service within previous 7 days.  She has a history of ADD and in the past given Adderall 10 mg.  The medication lasted roughly 7-1/2 hours however when it wore off she did have increased difficulty with fatigue from that.  She has subsequently stopped taking it but realizes the fact that she needs to get back on it.  Of note is the fact that her mother has ADD and her son is on Vyvanse.  Review of Systems     Objective:   Physical Exam  Alert and in no distress otherwise not examined      Assessment & Plan:  Attention deficit disorder (ADD) without hyperactivity - Plan: lisdexamfetamine (VYVANSE) 20 MG capsule Since she is having difficulty with when the medicine wears off I think a longer acting Friday would be useful.  She also needs more than 7-1/2 hours of benefit especially since she is taking care of her son.  She will call me and we can present dosing regimen is working we addressed accordingly.

## 2022-06-09 ENCOUNTER — Other Ambulatory Visit: Payer: Self-pay | Admitting: Family Medicine

## 2022-06-09 DIAGNOSIS — F988 Other specified behavioral and emotional disorders with onset usually occurring in childhood and adolescence: Secondary | ICD-10-CM

## 2022-06-11 NOTE — Telephone Encounter (Signed)
Refill request last apt 05/13/22 next apt 12/31/22.

## 2022-06-12 ENCOUNTER — Other Ambulatory Visit: Payer: Self-pay | Admitting: Family Medicine

## 2022-06-12 ENCOUNTER — Other Ambulatory Visit (HOSPITAL_BASED_OUTPATIENT_CLINIC_OR_DEPARTMENT_OTHER): Payer: Self-pay

## 2022-06-12 MED ORDER — LISDEXAMFETAMINE DIMESYLATE 30 MG PO CAPS: 30.0000 mg | ORAL_CAPSULE | Freq: Every day | ORAL | 0 refills | Status: DC

## 2022-06-12 NOTE — Progress Notes (Signed)
I discussed the Vyvanse dosing with her.  She does note that the 20 mg has helped her with her focus and she has had very little withdrawal symptoms.  She does think that it helps her stay on task.  She does have some question of whether she is getting the full benefit out of it and might need a higher dose.  I will give her the 30 mg.  She is to set up an visit for me in about a month so we can discuss the new dosing and its efficacy.

## 2022-06-13 ENCOUNTER — Other Ambulatory Visit (HOSPITAL_BASED_OUTPATIENT_CLINIC_OR_DEPARTMENT_OTHER): Payer: Self-pay

## 2022-06-13 ENCOUNTER — Encounter (HOSPITAL_BASED_OUTPATIENT_CLINIC_OR_DEPARTMENT_OTHER): Payer: Self-pay | Admitting: Pharmacist

## 2022-06-16 ENCOUNTER — Other Ambulatory Visit: Payer: Self-pay | Admitting: Family Medicine

## 2022-06-16 ENCOUNTER — Other Ambulatory Visit (HOSPITAL_BASED_OUTPATIENT_CLINIC_OR_DEPARTMENT_OTHER): Payer: Self-pay

## 2022-06-16 DIAGNOSIS — F988 Other specified behavioral and emotional disorders with onset usually occurring in childhood and adolescence: Secondary | ICD-10-CM

## 2022-06-16 MED ORDER — LISDEXAMFETAMINE DIMESYLATE 30 MG PO CAPS
30.0000 mg | ORAL_CAPSULE | Freq: Every day | ORAL | 0 refills | Status: DC
  Filled 2022-06-16 – 2022-06-17 (×2): qty 30, 30d supply, fill #0

## 2022-06-17 ENCOUNTER — Other Ambulatory Visit (HOSPITAL_BASED_OUTPATIENT_CLINIC_OR_DEPARTMENT_OTHER): Payer: Self-pay

## 2022-06-19 ENCOUNTER — Encounter: Payer: Self-pay | Admitting: Internal Medicine

## 2022-06-19 ENCOUNTER — Other Ambulatory Visit: Payer: Self-pay | Admitting: Obstetrics and Gynecology

## 2022-06-19 DIAGNOSIS — Z1231 Encounter for screening mammogram for malignant neoplasm of breast: Secondary | ICD-10-CM

## 2022-06-27 ENCOUNTER — Ambulatory Visit: Payer: Commercial Managed Care - PPO | Admitting: Family Medicine

## 2022-06-27 ENCOUNTER — Other Ambulatory Visit (HOSPITAL_BASED_OUTPATIENT_CLINIC_OR_DEPARTMENT_OTHER): Payer: Self-pay

## 2022-06-27 ENCOUNTER — Encounter: Payer: Self-pay | Admitting: Family Medicine

## 2022-06-27 VITALS — BP 122/84 | HR 94 | Temp 97.9°F | Resp 16 | Wt 156.4 lb

## 2022-06-27 DIAGNOSIS — J020 Streptococcal pharyngitis: Secondary | ICD-10-CM

## 2022-06-27 DIAGNOSIS — R0600 Dyspnea, unspecified: Secondary | ICD-10-CM | POA: Diagnosis not present

## 2022-06-27 DIAGNOSIS — F988 Other specified behavioral and emotional disorders with onset usually occurring in childhood and adolescence: Secondary | ICD-10-CM

## 2022-06-27 DIAGNOSIS — J301 Allergic rhinitis due to pollen: Secondary | ICD-10-CM | POA: Diagnosis not present

## 2022-06-27 DIAGNOSIS — M940 Chondrocostal junction syndrome [Tietze]: Secondary | ICD-10-CM | POA: Diagnosis not present

## 2022-06-27 MED ORDER — AMOXICILLIN 875 MG PO TABS
875.0000 mg | ORAL_TABLET | Freq: Two times a day (BID) | ORAL | 0 refills | Status: DC
  Filled 2022-06-27: qty 20, 10d supply, fill #0

## 2022-06-27 MED ORDER — ALBUTEROL SULFATE HFA 108 (90 BASE) MCG/ACT IN AERS
2.0000 | INHALATION_SPRAY | Freq: Four times a day (QID) | RESPIRATORY_TRACT | 0 refills | Status: AC | PRN
  Filled 2022-06-27: qty 8.5, 30d supply, fill #0

## 2022-06-27 NOTE — Patient Instructions (Signed)
Try the inhaler for several days and let me know how you are doing Take all the antibiotic and none left over Continue on the Vyvanse and let me know when you run out 2 Aleve twice per day for the next 7 to 10 days for the rib

## 2022-06-27 NOTE — Progress Notes (Addendum)
   Subjective:    Patient ID: Vanessa Daniels, female    DOB: [birth year], 46 y.o.   MRN: [id number]  HPI She is here for multiple concerns.  She states that on Tuesday she developed a slight left-sided sore throat as well as earache.  Her husband tested her for strep on Wednesday and it was positive.  She had Amoxil at home and has taken 2 doses of the medication and needs more medicine for this.  She has a 6 weeks history of difficulty with shortness of breath but no coughing, wheezing, shortness of breath.  She does have spring allergies.  She also complains of some left-sided chest discomfort when she takes a deep breath or moves her arms. She also has underlying ADD and presently is on Vyvanse 30 mg.  She was on 20 before that and has noted some improvement especially in her ability to focus.  She thinks it lasts roughly 12 hours.  She really not cannot tell when it wears off or starts.  She is comfortable with the present dosing regimen.   Review of Systems     Objective:   Physical Exam Alert and in no distress. Tympanic membranes and canals are normal. Pharyngeal area is normal. Neck is supple without adenopathy or thyromegaly. Cardiac exam shows a regular sinus rhythm without murmurs or gallops. Lungs are clear to auscultation.  She is slightly did tender to palpation over the third costochondral junction on the left and with splinting it did relieve her symptoms.        Assessment & Plan:  Strep pharyngitis  Costochondritis  Attention deficit disorder (ADD) without hyperactivity  Dyspnea, unspecified type  Seasonal allergic rhinitis due to pollen Try the inhaler for several days and let me know how you are doing Take all the antibiotic and none left over Continue on the Vyvanse and let me know when you run out 2 Aleve twice per day for the next 7 to 10 days for the rib Amoxil was called in.  She is to take the complete dosing of this medicine and had none left  over. Recommend 2 Aleve twice per day for the next 7 to 10 days for the costochondritis. Unsure of the shortness of breath but willing to try albuterol to see if this will help with her symptoms since she does have underlying allergies. Continue on present dosing of the Vyvanse.

## 2022-07-27 ENCOUNTER — Other Ambulatory Visit (HOSPITAL_BASED_OUTPATIENT_CLINIC_OR_DEPARTMENT_OTHER): Payer: Self-pay

## 2022-07-27 MED ORDER — NITROFURANTOIN MONOHYD MACRO 100 MG PO CAPS
100.0000 mg | ORAL_CAPSULE | Freq: Two times a day (BID) | ORAL | 0 refills | Status: AC
  Filled 2022-07-27: qty 10, 5d supply, fill #0

## 2022-08-09 ENCOUNTER — Ambulatory Visit
Admission: RE | Admit: 2022-08-09 | Discharge: 2022-08-09 | Disposition: A | Discharge status: Home / Self Care | Payer: Commercial Managed Care - PPO | Source: Ambulatory Visit | Attending: Obstetrics and Gynecology | Admitting: Obstetrics and Gynecology

## 2022-08-09 DIAGNOSIS — Z1231 Encounter for screening mammogram for malignant neoplasm of breast: Secondary | ICD-10-CM

## 2022-08-09 LAB — HM MAMMOGRAPHY

## 2022-10-22 ENCOUNTER — Ambulatory Visit: Payer: Commercial Managed Care - PPO | Admitting: Dermatology

## 2022-10-22 ENCOUNTER — Encounter: Payer: Self-pay | Admitting: Dermatology

## 2022-10-22 VITALS — BP 108/71

## 2022-10-22 DIAGNOSIS — L814 Other melanin hyperpigmentation: Secondary | ICD-10-CM | POA: Diagnosis not present

## 2022-10-22 DIAGNOSIS — D229 Melanocytic nevi, unspecified: Secondary | ICD-10-CM

## 2022-10-22 DIAGNOSIS — Z1283 Encounter for screening for malignant neoplasm of skin: Secondary | ICD-10-CM | POA: Diagnosis not present

## 2022-10-22 DIAGNOSIS — L821 Other seborrheic keratosis: Secondary | ICD-10-CM | POA: Diagnosis not present

## 2022-10-22 DIAGNOSIS — D1801 Hemangioma of skin and subcutaneous tissue: Secondary | ICD-10-CM | POA: Diagnosis not present

## 2022-10-22 DIAGNOSIS — Z808 Family history of malignant neoplasm of other organs or systems: Secondary | ICD-10-CM

## 2022-10-22 DIAGNOSIS — W908XXA Exposure to other nonionizing radiation, initial encounter: Secondary | ICD-10-CM | POA: Diagnosis not present

## 2022-10-22 DIAGNOSIS — L578 Other skin changes due to chronic exposure to nonionizing radiation: Secondary | ICD-10-CM

## 2022-10-22 DIAGNOSIS — X32XXXA Exposure to sunlight, initial encounter: Secondary | ICD-10-CM

## 2022-10-22 NOTE — Patient Instructions (Signed)
    Due to recent changes in healthcare laws, you may see results of your pathology and/or laboratory studies on MyChart before the doctors have had a chance to review them. We understand that in some cases there may be results that are confusing or concerning to you. Please understand that not all results are received at the same time and often the doctors may need to interpret multiple results in order to provide you with the best plan of care or course of treatment. Therefore, we ask that you please give us 2 business days to thoroughly review all your results before contacting the office for clarification. Should we see a critical lab result, you will be contacted sooner.   If You Need Anything After Your Visit  If you have any questions or concerns for your doctor, please call our main line at [phone] If no one answers, please leave a voicemail as directed and we will return your call as soon as possible. Messages left after 4 pm will be answered the following business day.   You may also send us a message via MyChart. We typically respond to MyChart messages within 1-2 business days.  For prescription refills, please ask your pharmacy to contact our office. Our fax number is [phone].  If you have an urgent issue when the clinic is closed that cannot wait until the next business day, you can page your doctor at the number below.    Please note that while we do our best to be available for urgent issues outside of office hours, we are not available 24/7.   If you have an urgent issue and are unable to reach us, you may choose to seek medical care at your doctor's office, retail clinic, urgent care center, or emergency room.  If you have a medical emergency, please immediately call 911 or go to the emergency department. In the event of inclement weather, please call our main line at [phone] for an update on the status of any delays or closures.  Dermatology Medication  Tips: Please keep the boxes that topical medications come in in order to help keep track of the instructions about where and how to use these. Pharmacies typically print the medication instructions only on the boxes and not directly on the medication tubes.   If your medication is too expensive, please contact our office at [phone] or send us a message through MyChart.   We are unable to tell what your co-pay for medications will be in advance as this is different depending on your insurance coverage. However, we may be able to find a substitute medication at lower cost or fill out paperwork to get insurance to cover a needed medication.   If a prior authorization is required to get your medication covered by your insurance company, please allow us 1-2 business days to complete this process.  Drug prices often vary depending on where the prescription is filled and some pharmacies may offer cheaper prices.  The website www.goodrx.com contains coupons for medications through different pharmacies. The prices here do not account for what the cost may be with help from insurance (it may be cheaper with your insurance), but the website can give you the price if you did not use any insurance.  - You can print the associated coupon and take it with your prescription to the pharmacy.  - You may also stop by our office during regular business hours and pick up a GoodRx coupon card.  -   If you need your prescription sent electronically to a different pharmacy, notify our office through [facility] MyChart or by phone at [phone]    Skin Education :   I counseled the patient regarding the following: Sun screen (SPF 30 or greater) should be applied during peak UV exposure (between 10am and 2pm) and reapplied after exercise or swimming.  The ABCDEs of melanoma were reviewed with the patient, and the importance of monthly self-examination of moles was emphasized. Should any moles change in shape or  color, or itch, bleed or burn, pt will contact our office for evaluation sooner then their interval appointment.  Plan: Sunscreen Recommendations I recommended a broad spectrum sunscreen with a SPF of 30 or higher. I explained that SPF 30 sunscreens block approximately 97 percent of the sun's harmful rays. Sunscreens should be applied at least 15 minutes prior to expected sun exposure and then every 2 hours after that as long as sun exposure continues. If swimming or exercising sunscreen should be reapplied every 45 minutes to an hour after getting wet or sweating. One ounce, or the equivalent of a shot glass full of sunscreen, is adequate to protect the skin not covered by a bathing suit. I also recommended a lip balm with a sunscreen as well. Sun protective clothing can be used in lieu of sunscreen but must be worn the entire time you are exposed to the sun's rays.  

## 2022-10-22 NOTE — Progress Notes (Signed)
   New Patient Visit   Subjective  Vanessa Daniels is a 47 y.o. female who presents for the following: Skin Cancer Screening and Full Body Skin Exam  The patient presents for Total-Body Skin Exam (TBSE) for skin cancer screening and mole check. The patient has spots, moles and lesions to be evaluated, some may be new or changing and the patient has concerns that these could be cancer.  She has no personal history of skin cancer. Mother with history of Melanoma. Her last skin exam was a few years ago. She also wants to possibly discuss Botox for cosmetic reasons.   The following portions of the chart were reviewed this encounter and updated as appropriate: medications, allergies, medical history  Review of Systems:  No other skin or systemic complaints except as noted in HPI or Assessment and Plan.  Objective  Well appearing patient in no apparent distress; mood and affect are within normal limits.  A full examination was performed including scalp, head, eyes, ears, nose, lips, neck, chest, axillae, abdomen, back, buttocks, bilateral upper extremities, bilateral lower extremities, hands, feet, fingers, toes, fingernails, and toenails. All findings within normal limits unless otherwise noted below.   Relevant physical exam findings are noted in the Assessment and Plan.    Assessment & Plan   LENTIGINES, SEBORRHEIC KERATOSES, HEMANGIOMAS - Benign normal skin lesions - Benign-appearing - Call for any changes  MELANOCYTIC NEVI - Tan-brown and/or pink-flesh-colored symmetric macules and papules - Benign appearing on exam today - Observation - Call clinic for new or changing moles - Recommend daily use of broad spectrum spf 30+ sunscreen to sun-exposed areas.   ACTINIC DAMAGE - Chronic condition, secondary to cumulative UV/sun exposure - diffuse scaly erythematous macules with underlying dyspigmentation - Recommend daily broad spectrum sunscreen SPF 30+ to sun-exposed areas,  reapply every 2 hours as needed.  - Staying in the shade or wearing long sleeves, sun glasses (UVA+UVB protection) and wide brim hats (4-inch brim around the entire circumference of the hat) are also recommended for sun protection.  - Call for new or changing lesions.  Sebaceous Hyperplasias Exam: overgrowth of oil glands on the face  Treatment Plan: Reassurance given/Benign     SKIN CANCER SCREENING PERFORMED TODAY.      Return in about 1 year (around 10/22/2023) for TBSE.  Jaclynn Guarneri, CMA, am acting as scribe for [employer], DO.   Documentation: I have reviewed the above documentation for accuracy and completeness, and I agree with the above.  Langston Reusing, DO

## 2022-11-05 ENCOUNTER — Encounter: Payer: Self-pay | Admitting: Family Medicine

## 2022-11-05 DIAGNOSIS — F419 Anxiety disorder, unspecified: Secondary | ICD-10-CM

## 2022-11-06 ENCOUNTER — Other Ambulatory Visit (HOSPITAL_BASED_OUTPATIENT_CLINIC_OR_DEPARTMENT_OTHER): Payer: Self-pay

## 2022-11-07 MED ORDER — ALPRAZOLAM 0.25 MG PO TABS: ORAL_TABLET | ORAL | 1 refills | Status: DC

## 2022-11-07 NOTE — Addendum Note (Signed)
Addended by: Zea Kostka, Huntley Dec E on: 11/07/2022 07:12 AM   Modules accepted: Orders

## 2022-12-27 ENCOUNTER — Encounter: Payer: 59 | Admitting: Family Medicine

## 2022-12-31 ENCOUNTER — Encounter: Payer: Self-pay | Admitting: Family Medicine

## 2022-12-31 ENCOUNTER — Ambulatory Visit (INDEPENDENT_AMBULATORY_CARE_PROVIDER_SITE_OTHER): Payer: Commercial Managed Care - PPO | Admitting: Family Medicine

## 2022-12-31 VITALS — BP 102/76 | HR 76 | Temp 98.2°F | Resp 14 | Ht 63.75 in | Wt 162.0 lb

## 2022-12-31 DIAGNOSIS — F419 Anxiety disorder, unspecified: Secondary | ICD-10-CM

## 2022-12-31 DIAGNOSIS — Z8619 Personal history of other infectious and parasitic diseases: Secondary | ICD-10-CM | POA: Diagnosis not present

## 2022-12-31 DIAGNOSIS — D069 Carcinoma in situ of cervix, unspecified: Secondary | ICD-10-CM

## 2022-12-31 DIAGNOSIS — Z0000 Encounter for general adult medical examination without abnormal findings: Secondary | ICD-10-CM

## 2022-12-31 LAB — CBC WITH DIFFERENTIAL/PLATELET
Basophils Absolute: 0 10*3/uL (ref 0.0–0.2)
Basos: 1 %
EOS (ABSOLUTE): 0.2 10*3/uL (ref 0.0–0.4)
Hematocrit: 42.9 % (ref 34.0–46.6)
Immature Grans (Abs): 0 10*3/uL (ref 0.0–0.1)
Lymphs: 30 %
MCH: 29.4 pg (ref 26.6–33.0)
MCHC: 32.6 g/dL (ref 31.5–35.7)
Monocytes Absolute: 0.4 10*3/uL (ref 0.1–0.9)
Neutrophils: 58 %
Platelets: 310 10*3/uL (ref 150–450)
RBC: 4.76 x10E6/uL (ref 3.77–5.28)
WBC: 5.1 10*3/uL (ref 3.4–10.8)

## 2022-12-31 LAB — COMPREHENSIVE METABOLIC PANEL

## 2022-12-31 LAB — LIPID PANEL

## 2022-12-31 NOTE — Progress Notes (Signed)
Complete physical exam  Patient: Vanessa Daniels   DOB: 1975-07-16   47 y.o. Female  MRN: [id number]  Subjective:    Chief Complaint  Patient presents with   Annual Exam    CPE. Non-fasting.     Vanessa Daniels is a 47 y.o. female who presents today for a complete physical exam.  She reports consuming a general diet. The patient does not participate in regular exercise at present. She generally feels fairly well. She reports sleeping fairly well.  He continues to be followed by gynecology and her most recent Pap was normal.  She does occasionally have difficulty with herpes labialis.  She has medications for that.  She seems to be handling her anxiety fairly well.  She has learned some good coping mechanisms and uses Xanax mainly for travel.  Recently she has done a lot of traveling and did run into some difficulties with the weather and airlines.  She is in the process of trying to change the custody arrangement for her son with the father.  She is married and the marriage is going quite well.  He is very supportive.  She has no other concerns or complaints.  Most recent fall risk assessment:    12/31/2022    2:07 PM  Fall Risk   Falls in the past year? 0  Number falls in past yr: 0  Injury with Fall? 0  Risk for fall due to : No Fall Risks  Follow up Falls evaluation completed     Most recent depression screenings:    12/31/2022    2:07 PM 12/13/2021    8:43 AM  PHQ 2/9 Scores  PHQ - 2 Score 0 0    Vision:Not within last year  and Dental: Receives regular dental care    Immunization History  Administered Date(s) Administered   COVID-19, mRNA, vaccine(Comirnaty)12 years and older 03/10/2022   DT (Pediatric) 11/17/1992, 03/13/2004   DTaP 08/09/1975, 09/09/1975, 11/09/1975, 02/08/1977   IPV 07/12/1975, 09/09/1975, 11/09/1975, 02/08/1977   Influenza Inj Mdck Quad Pf 03/16/2017   Influenza Inj Mdck Quad With Preservative 03/11/2019   Influenza Split 05/23/2011,  04/07/2012, 06/03/2015   Influenza,inj,Quad PF,6+ Mos 03/06/2016, 03/03/2020   Influenza-Unspecified 03/10/2016, 03/01/2018, 03/10/2018, 02/13/2019, 02/09/2020, 03/28/2021, 03/10/2022   MMR 10/08/1976, 12/05/1992   PFIZER(Purple Top)SARS-COV-2 Vaccination 09/03/2019, 09/28/2019, 05/05/2020, 10/16/2020   Pfizer Covid-19 Vaccine Bivalent Booster 69yrs & up 03/28/2021   Tdap 04/07/2012, 12/13/2021    Health Maintenance  Topic Date Due   HIV Screening  Never done   COVID-19 Vaccine (7 - 2023-24 season) 05/05/2022   INFLUENZA VACCINE  01/09/2023   Fecal DNA (Cologuard)  12/19/2023   PAP SMEAR-Modifier  01/06/2024   DTaP/Tdap/Td (9 - Td or Tdap) 12/14/2031   Hepatitis C Screening  Completed   HPV VACCINES  Aged Out    Patient Care Team: Ronnald Nian, MD as PCP - General (Family Medicine)   Outpatient Medications Prior to Visit  Medication Sig   albuterol (VENTOLIN HFA) 108 (90 Base) MCG/ACT inhaler Inhale 2 puffs into the lungs every 6 (six) hours as needed for wheezing or shortness of breath.   ALPRAZolam (XANAX) 0.25 MG tablet Take 1 tab (0.25mg ) 30 minutes before travel. Up to 2 doses per day.   hydrocortisone cream 1 % Apply 1 application  topically as needed for itching.   levonorgestrel (MIRENA, 52 MG,) 20 MCG/DAY IUD Mirena 20 mcg/24 hours (7 yrs) 52 mg intrauterine device  Take 1 device by intrauterine route.  OVER THE COUNTER MEDICATION daily. Little critters omega 3. 2 gummies   valACYclovir (VALTREX) 1000 MG tablet    No facility-administered medications prior to visit.    Review of Systems  All other systems reviewed and are negative.   Family and social history as well as health maintenance and immunizations was reviewed.     Objective:    BP 102/76 (BP Location: Left Arm, Cuff Size: Large)   Pulse 76   Temp 98.2 F (36.8 C) (Oral)   Resp 14   Ht 5' 3.75" (1.619 m)   Wt 162 lb (73.5 kg)   LMP 12/22/2022   SpO2 96% Comment: room air  BMI 28.03 kg/m     Physical Exam   No results found for any visits on 12/31/22.     Assessment & Plan:     Routine general medical examination at a health care facility - Plan: CBC with Differential/Platelet, Comprehensive metabolic panel, Lipid panel  Anxiety  CIN III (cervical intraepithelial neoplasia III)  History of herpes labialis  In general things are going quite well.  She seems to have a good handle on how to handle the father of the child and legal ramifications of that.  I have no problem giving her Xanax on an as-needed basis for the anxiety.  We also briefly discussed the potential for down the road of her becoming more more involved in taking care of her parents as her physical abilities start to diminish.     Sharlot Gowda, MD

## 2023-01-01 LAB — LIPID PANEL
Cholesterol, Total: 182 mg/dL (ref 100–199)
HDL: 59 mg/dL (ref >39)
Triglycerides: 87 mg/dL (ref 0–149)

## 2023-01-01 LAB — COMPREHENSIVE METABOLIC PANEL
ALT: 27 IU/L (ref 0–32)
AST: 22 IU/L (ref 0–40)
Albumin: 4.2 g/dL (ref 3.9–4.9)
BUN/Creatinine Ratio: 17 (ref 9–23)
BUN: 13 mg/dL (ref 6–24)
CO2: 26 mmol/L (ref 20–29)
Calcium: 9.1 mg/dL (ref 8.7–10.2)
Chloride: 102 mmol/L (ref 96–106)
Total Protein: 6.7 g/dL (ref 6.0–8.5)

## 2023-01-01 LAB — CBC WITH DIFFERENTIAL/PLATELET
Eos: 3 %
Hemoglobin: 14 g/dL (ref 11.1–15.9)
Immature Granulocytes: 0 %
Lymphocytes Absolute: 1.5 10*3/uL (ref 0.7–3.1)
MCV: 90 fL (ref 79–97)
Monocytes: 8 %
Neutrophils Absolute: 3 10*3/uL (ref 1.4–7.0)
RDW: 12.5 % (ref 11.7–15.4)

## 2023-01-08 ENCOUNTER — Other Ambulatory Visit (HOSPITAL_BASED_OUTPATIENT_CLINIC_OR_DEPARTMENT_OTHER): Payer: Self-pay

## 2023-01-21 ENCOUNTER — Other Ambulatory Visit: Payer: Self-pay | Admitting: Family Medicine

## 2023-01-22 ENCOUNTER — Other Ambulatory Visit (HOSPITAL_COMMUNITY): Payer: Self-pay

## 2023-01-22 MED ORDER — VALACYCLOVIR HCL 1 G PO TABS
1000.0000 mg | ORAL_TABLET | Freq: Two times a day (BID) | ORAL | 1 refills | Status: DC
  Filled 2023-01-22 – 2023-01-24 (×2): qty 20, 10d supply, fill #0
  Filled 2023-05-06: qty 20, 10d supply, fill #1
  Filled 2023-05-07: qty 20, 10d supply, fill #0

## 2023-01-25 ENCOUNTER — Other Ambulatory Visit (HOSPITAL_BASED_OUTPATIENT_CLINIC_OR_DEPARTMENT_OTHER): Payer: Self-pay

## 2023-02-06 ENCOUNTER — Other Ambulatory Visit (HOSPITAL_BASED_OUTPATIENT_CLINIC_OR_DEPARTMENT_OTHER): Payer: Self-pay

## 2023-02-06 MED ORDER — CEPHALEXIN 500 MG PO CAPS
500.0000 mg | ORAL_CAPSULE | Freq: Four times a day (QID) | ORAL | 0 refills | Status: DC
  Filled 2023-02-06: qty 40, 10d supply, fill #0

## 2023-02-06 MED ORDER — CLOTRIMAZOLE-BETAMETHASONE 1-0.05 % EX CREA
1.0000 | TOPICAL_CREAM | Freq: Two times a day (BID) | CUTANEOUS | 0 refills | Status: DC
  Filled 2023-02-06: qty 15, 30d supply, fill #0

## 2023-02-07 ENCOUNTER — Other Ambulatory Visit (HOSPITAL_BASED_OUTPATIENT_CLINIC_OR_DEPARTMENT_OTHER): Payer: Self-pay

## 2023-03-03 ENCOUNTER — Other Ambulatory Visit (HOSPITAL_BASED_OUTPATIENT_CLINIC_OR_DEPARTMENT_OTHER): Payer: Self-pay

## 2023-03-03 ENCOUNTER — Ambulatory Visit: Payer: Commercial Managed Care - PPO | Admitting: Dermatology

## 2023-03-03 ENCOUNTER — Encounter: Payer: Self-pay | Admitting: Dermatology

## 2023-03-03 DIAGNOSIS — L710 Perioral dermatitis: Secondary | ICD-10-CM

## 2023-03-03 DIAGNOSIS — Z124 Encounter for screening for malignant neoplasm of cervix: Secondary | ICD-10-CM | POA: Diagnosis not present

## 2023-03-03 DIAGNOSIS — L821 Other seborrheic keratosis: Secondary | ICD-10-CM | POA: Diagnosis not present

## 2023-03-03 DIAGNOSIS — Z01419 Encounter for gynecological examination (general) (routine) without abnormal findings: Secondary | ICD-10-CM | POA: Diagnosis not present

## 2023-03-03 MED ORDER — PIMECROLIMUS 1 % EX CREA
TOPICAL_CREAM | Freq: Every day | CUTANEOUS | 2 refills | Status: AC
  Filled 2023-03-03: qty 30, 25d supply, fill #0

## 2023-03-03 MED ORDER — DOXYCYCLINE HYCLATE 100 MG PO TABS
100.0000 mg | ORAL_TABLET | Freq: Every day | ORAL | 1 refills | Status: AC
  Filled 2023-03-03: qty 60, 60d supply, fill #0

## 2023-03-03 NOTE — Progress Notes (Signed)
   Follow-Up Visit   Subjective  Vanessa Daniels is a 47 y.o. female who presents for the following: She has had some redness around nose and at the corner of her right eye x ~2 months. Sometimes it is itchy and burns. She does get little bumps sometimes. She had a telehealth appointment and she was given Cephalexin and Clotrimazole/Betamethasone cream.    The following portions of the chart were reviewed this encounter and updated as appropriate: medications, allergies, medical history  Review of Systems:  No other skin or systemic complaints except as noted in HPI or Assessment and Plan.  Objective  Well appearing patient in no apparent distress; mood and affect are within normal limits.  A focused examination was performed of the following areas: Face  Relevant exam findings are noted in the Assessment and Plan.    Assessment & Plan   Perioral Dermatitis Exam: faint residual pink papules on left lateral nasal crease  Treatment Plan:   Perioral Dermatitis with Extension to the Eye - Plan: Discontinue cephalexin and clotrimazole with betamethasone cream. Start doxycycline, to be taken with food, preferably dinner, daily for two months. Prescribe pimecrolimus cream, to be applied morning and night every day for two months and reassess after this period. If pimecrolimus is not covered by insurance or is too expensive, consider switching to tacrolimus ointment. Advise the patient to clean makeup brushes and ensure makeup is new to avoid folliculitis. Educate the patient on potential hormonal changes at age 89 and their impact on skin issues.     SEBORRHEIC KERATOSIS - Stuck-on, waxy, tan-brown papules and/or plaques  - Benign-appearing - Discussed benign etiology and prognosis. - Observe - Call for any changes      Seborrheic keratosis  Perioral dermatitis    No follow-ups on file.  I, Joanie Coddington, CMA, am acting as scribe for [employer], DO  .   Documentation: I have reviewed the above documentation for accuracy and completeness, and I agree with the above.  Langston Reusing, DO

## 2023-03-03 NOTE — Patient Instructions (Signed)
Hello Vanessa Daniels,  Thank you for visiting our clinic today. Your dedication to addressing your skin concerns and improving your health is greatly appreciated. Here is a summary of the key instructions from today's consultation:  - Medications Prescribed:   - Doxycycline: Take one dose daily with dinner to minimize potential stomach issues. Continue this regimen for two months.   - Pimecrolimus Cream: Apply this cream topically in the morning and at night every day for two months. Should insurance not cover this, we will consider switching to Tacrolimus ointment.  - Lifestyle Adjustments:   - It's important to clean your makeup brushes regularly and use fresh makeup to prevent bacterial contamination.  - Follow-Up:   - We will reassess your condition in two months to monitor your progress and adjust your treatment plan as necessary.  Please do not hesitate to reach out if you have any questions or concerns regarding your treatment. We are here to support you throughout your journey to better skin health.  Warm regards,  Dr. [facility]   Important Information  Due to recent changes in healthcare laws, you may see results of your pathology and/or laboratory studies on MyChart before the doctors have had a chance to review them. We understand that in some cases there may be results that are confusing or concerning to you. Please understand that not all results are received at the same time and often the doctors may need to interpret multiple results in order to provide you with the best plan of care or course of treatment. Therefore, we ask that you please give [place] 2 business days to thoroughly review all your results before contacting the office for clarification. Should we see a critical lab result, you will be contacted sooner.   If You Need Anything After Your Visit  If you have any questions or concerns for your doctor, please call our main line at [phone] If no one  answers, please leave a voicemail as directed and we will return your call as soon as possible. Messages left after 4 pm will be answered the following business day.   You may also send [place] a message via MyChart. We typically respond to MyChart messages within 1-2 business days.  For prescription refills, please ask your pharmacy to contact our office. Our fax number is [phone].  If you have an urgent issue when the clinic is closed that cannot wait until the next business day, you can page your doctor at the number below.    Please note that while we do our best to be available for urgent issues outside of office hours, we are not available 24/7.   If you have an urgent issue and are unable to reach [place], you may choose to seek medical care at your doctor's office, retail clinic, urgent care center, or emergency room.  If you have a medical emergency, please immediately call 911 or go to the emergency department. In the event of inclement weather, please call our main line at [phone] for an update on the status of any delays or closures.  Dermatology Medication Tips: Please keep the boxes that topical medications come in in order to help keep track of the instructions about where and how to use these. Pharmacies typically print the medication instructions only on the boxes and not directly on the medication tubes.   If your medication is too expensive, please contact our office at [phone] or send [place] a message through MyChart.   We are  unable to tell what your co-pay for medications will be in advance as this is different depending on your insurance coverage. However, we may be able to find a substitute medication at lower cost or fill out paperwork to get insurance to cover a needed medication.   If a prior authorization is required to get your medication covered by your insurance company, please allow [place] 1-2 business days to complete this process.  Drug prices often vary  depending on where the prescription is filled and some pharmacies may offer cheaper prices.  The website www.goodrx.com contains coupons for medications through different pharmacies. The prices here do not account for what the cost may be with help from insurance (it may be cheaper with your insurance), but the website can give you the price if you did not use any insurance.  - You can print the associated coupon and take it with your prescription to the pharmacy.  - You may also stop by our office during regular business hours and pick up a GoodRx coupon card.  - If you need your prescription sent electronically to a different pharmacy, notify our office through [facility] or by phone at [phone]

## 2023-03-04 ENCOUNTER — Other Ambulatory Visit: Payer: Self-pay

## 2023-03-04 ENCOUNTER — Encounter: Payer: Self-pay | Admitting: Family Medicine

## 2023-03-05 ENCOUNTER — Other Ambulatory Visit (HOSPITAL_BASED_OUTPATIENT_CLINIC_OR_DEPARTMENT_OTHER): Payer: Self-pay

## 2023-03-07 LAB — HM PAP SMEAR: HPV, high-risk: NEGATIVE

## 2023-04-03 IMAGING — MG DIGITAL SCREENING BREAST BILAT IMPLANT W/ TOMO W/ CAD
8 of 12 series · 8 of 28 positions shown · non-contrast
Comparison: Previous exam(s).

CLINICAL DATA: Screening.

EXAM:
DIGITAL SCREENING BILATERAL MAMMOGRAM WITH IMPLANTS, CAD AND
TOMOSYNTHESIS
TECHNIQUE: Bilateral screening digital craniocaudal and mediolateral oblique
mammograms were obtained. Bilateral screening digital breast
tomosynthesis was performed. The images were evaluated with
computer-aided detection. Standard and/or implant displaced views
were performed.

[R MLO]
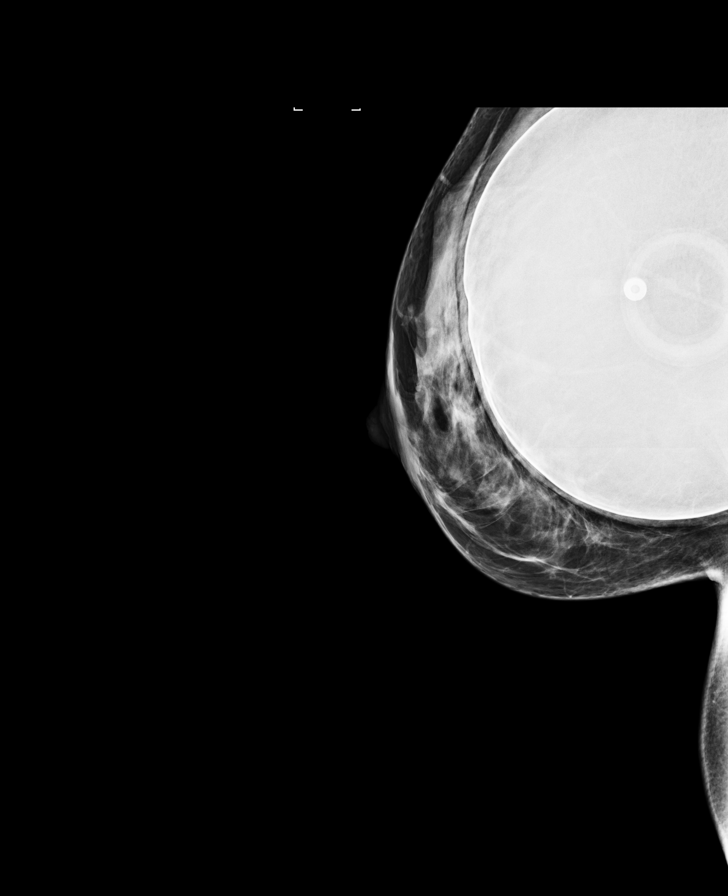

[R CC]
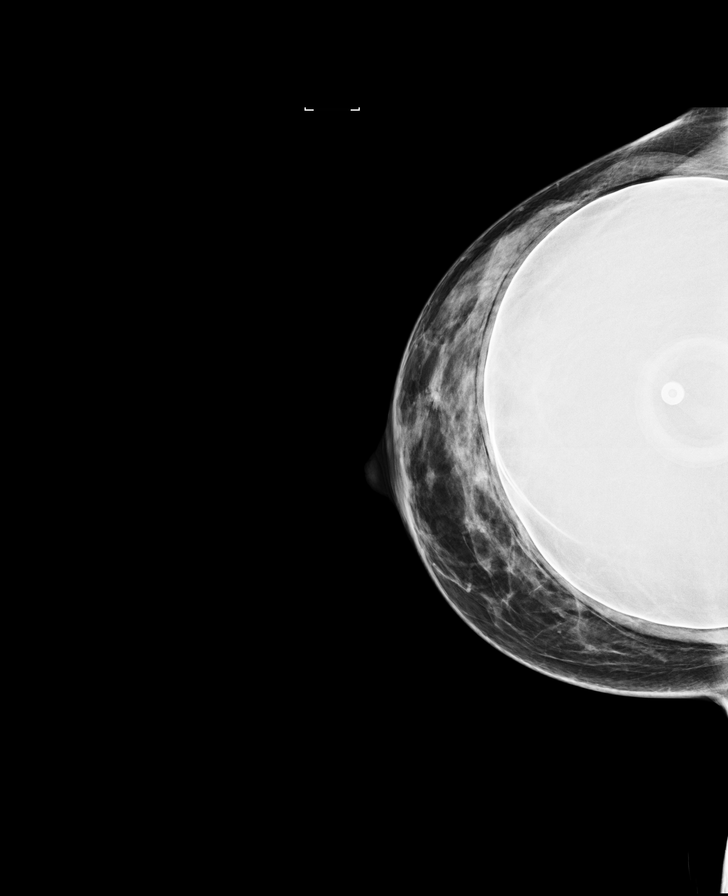

[L CC]
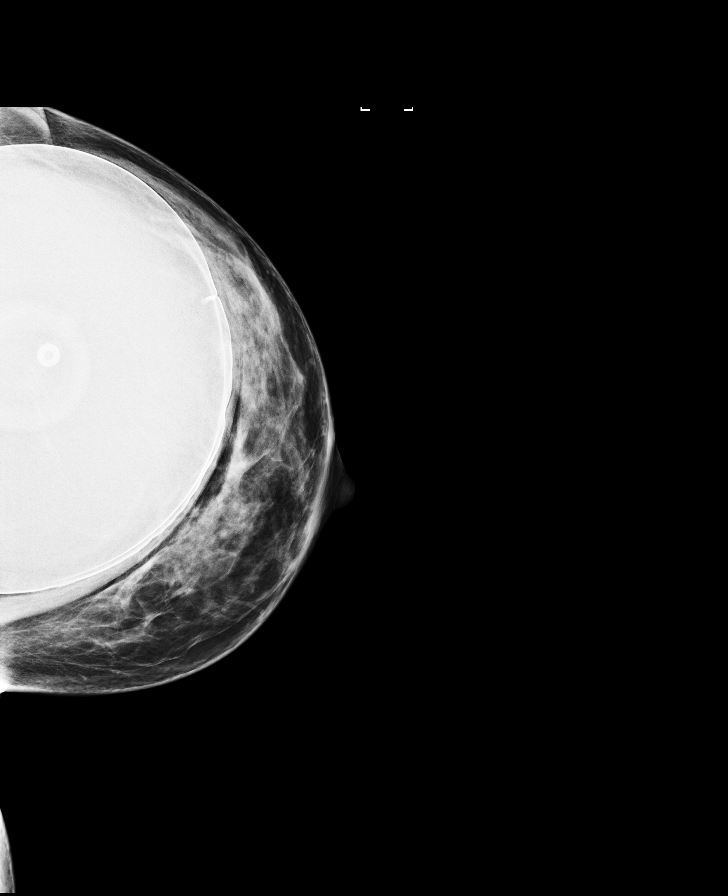

[L MLO]
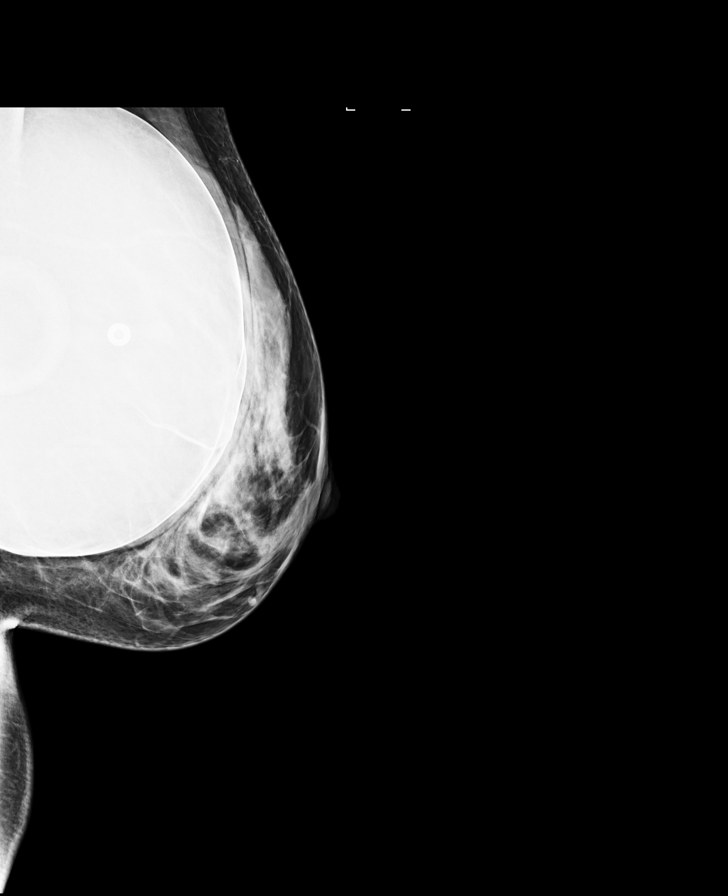

[R CC synth-2D]
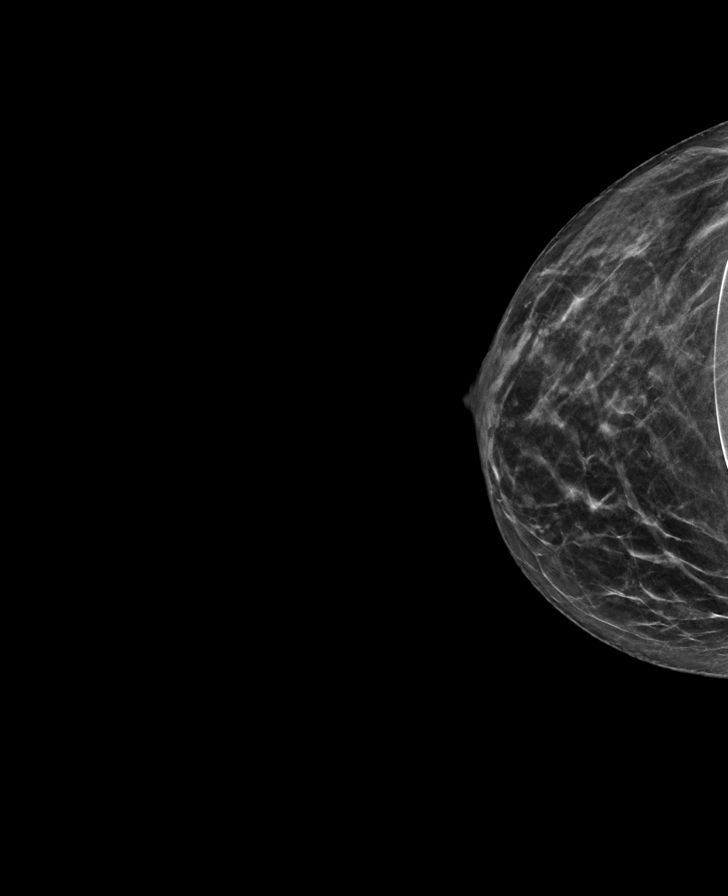

[L MLO synth-2D]
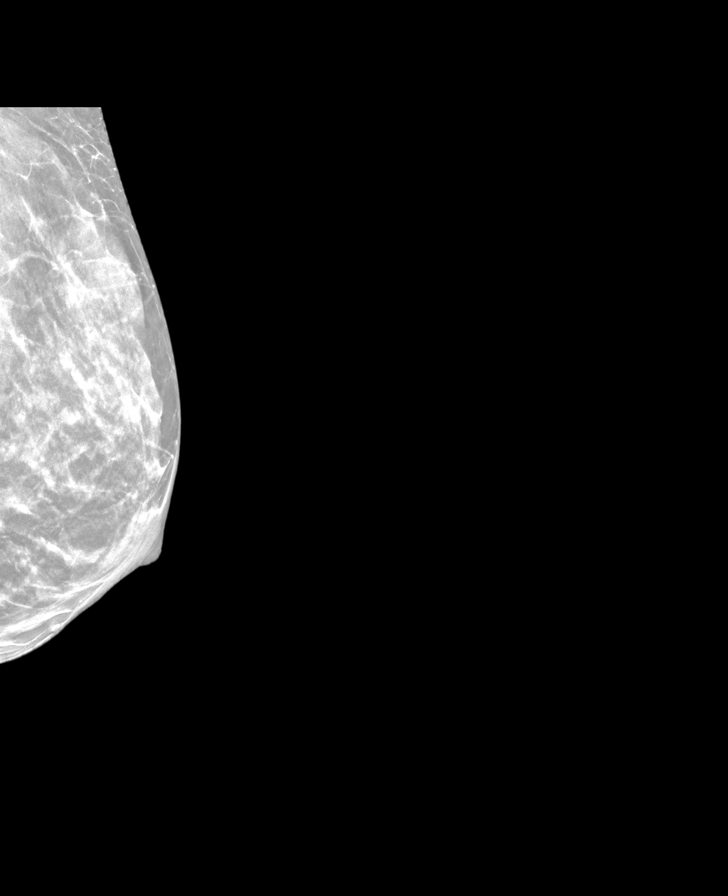

[L CC synth-2D]
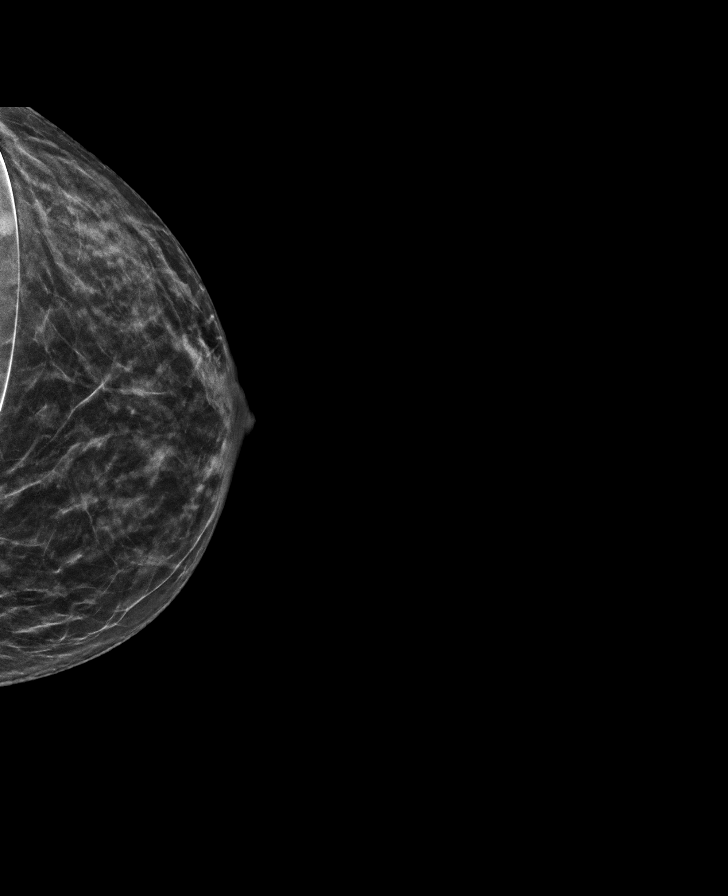

[R MLO synth-2D]
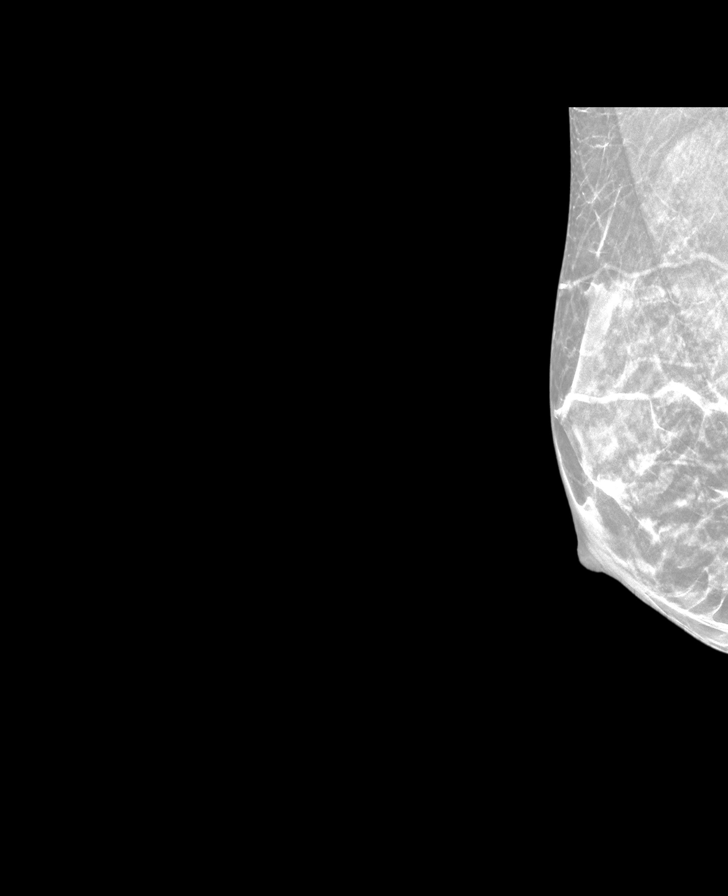

[8 of 28 positions shown; findings below may reference images not displayed]

ACR Breast Density Category c: The breast tissue is heterogeneously
dense, which may obscure small masses.
FINDINGS: The patient has retropectoral implants. There are no findings
suspicious for malignancy.
IMPRESSION: No mammographic evidence of malignancy. A result letter of this
screening mammogram will be mailed directly to the patient.

RECOMMENDATION:
Screening mammogram in one year. (Code:LT-E-7TH)

BI-RADS CATEGORY  1:  Negative.

## 2023-04-14 ENCOUNTER — Other Ambulatory Visit (HOSPITAL_BASED_OUTPATIENT_CLINIC_OR_DEPARTMENT_OTHER): Payer: Self-pay

## 2023-04-14 ENCOUNTER — Encounter: Payer: Self-pay | Admitting: Family Medicine

## 2023-04-14 ENCOUNTER — Ambulatory Visit: Payer: Commercial Managed Care - PPO | Admitting: Family Medicine

## 2023-04-14 ENCOUNTER — Ambulatory Visit (INDEPENDENT_AMBULATORY_CARE_PROVIDER_SITE_OTHER): Payer: Commercial Managed Care - PPO

## 2023-04-14 ENCOUNTER — Other Ambulatory Visit: Payer: Self-pay

## 2023-04-14 VITALS — BP 106/78 | HR 93 | Ht 63.75 in | Wt 169.0 lb

## 2023-04-14 DIAGNOSIS — M25561 Pain in right knee: Secondary | ICD-10-CM

## 2023-04-14 MED ORDER — LORAZEPAM 0.5 MG PO TABS
ORAL_TABLET | ORAL | 0 refills | Status: DC
  Filled 2023-04-14: qty 4, 2d supply, fill #0

## 2023-04-14 NOTE — Patient Instructions (Addendum)
Thank you for coming in today.   Please use Voltaren gel (Generic Diclofenac Gel) up to 4x daily for pain as needed.  This is available over-the-counter as both the name brand Voltaren gel and the generic diclofenac gel.   Please get an Xray today before you leave   Take the Ativan about 1 hour before your MRI. *you will need a driver*  You should hear from MRI scheduling within 1 week. If you do not hear please let me know.    Once we get a read back I will likely have you return for injection.  If we see a surgical problem I will get you in with a good surgeon.

## 2023-04-14 NOTE — Progress Notes (Signed)
I, Stevenson Clinch, CMA acting as a scribe for [name], MD.  Vanessa Daniels is a 47 y.o. female who presents to [facility] at [facility] today for R knee pain. Pt was previously seen by Dr. Denyse Amass in 02/08/21 for a R ankle sprain.  Today, pt c/o R knee pain ongoing for about 2 months. Pt locates pain to lateral aspect of the knee. Sx worse with knee extension after prolonged time sitting or when flexing the foot while in bed. Notes intermittent visible swelling, worse with more activity.   R Knee swelling: yes Mechanical symptoms: feels like it will give out at times, no falls she notes locking and catching.  She almost fell the other evening. Aggravates: as above Treatments tried: ice, heat, elevation, IBU, Naproxen  Pertinent review of systems: No fevers or chills  Relevant historical information: Anxiety   Exam:  BP 106/78   Pulse 93   Ht 5' 3.75" (1.619 m)   Wt 169 lb (76.7 kg)   SpO2 99%   BMI 29.24 kg/m  General: Well Developed, well nourished, and in no acute distress.   MSK: Right knee Normal-appearing Normal motion without crepitation. Tender palpation anterior lateral knee joint line. Stable ligamentous exam. Negative McMurray's test. Intact strength. Positive Thessaly's test.   Lab and Radiology Results  Diagnostic Limited MSK Ultrasound of: Right knee Quad tendon intact normal appearing. Patellar tendon is normal appearing. Lateral joint line no obvious abnormalities. Medial joint line normal appearing Impression: No clear abnormality   X-ray images right knee obtained today personally and independently interpreted Minimal degenerative changes.  No acute fractures are visible. Await formal radiology review  Assessment and Plan: 47 y.o. female with right lateral knee pain with mechanical symptoms.  Symptoms are worsening.  X-ray and ultrasound are unrevealing.  Plan for MRI to further characterize source of mechanical knee pain  and for potential surgical planning.   PDMP reviewed during this encounter. Orders Placed This Encounter  Procedures   [place] LIMITED JOINT SPACE STRUCTURES LOW RIGHT(NO LINKED CHARGES)    Order Specific Question:   Reason for Exam (SYMPTOM  OR DIAGNOSIS REQUIRED)    Answer:   right knee pain    Order Specific Question:   Preferred imaging location?    Answer:   [facility] Knee AP/LAT W/Sunrise Right    Standing Status:   Future    Number of Occurrences:   1    Standing Expiration Date:   05/14/2023    Order Specific Question:   Reason for Exam (SYMPTOM  OR DIAGNOSIS REQUIRED)    Answer:   right knee pain    Order Specific Question:   Preferred imaging location?    Answer:   [facility]    Order Specific Question:   Is patient pregnant?    Answer:   No   MR Knee Right Wo Contrast    Standing Status:   Future    Standing Expiration Date:   04/13/2024    Order Specific Question:   What is the patient's sedation requirement?    Answer:   Anti-anxiety    Order Specific Question:   Does the patient have a pacemaker or implanted devices?    Answer:   No    Order Specific Question:   Preferred imaging location?    Answer:   [facility] (table limit-550lbs)   Meds ordered this encounter  Medications   LORazepam (ATIVAN) 0.5 MG  tablet    Sig: Take 1 to 2 tablets by mouth 30 - 60 min prior to MRI. Do not drive with this medicine.    Dispense:  4 tablet    Refill:  0     Discussed warning signs or symptoms. Please see discharge instructions. Patient expresses understanding.   The above documentation has been reviewed and is accurate and complete [name], M.D.

## 2023-05-05 ENCOUNTER — Ambulatory Visit: Payer: Commercial Managed Care - PPO | Admitting: Dermatology

## 2023-05-05 ENCOUNTER — Encounter: Payer: Self-pay | Admitting: Dermatology

## 2023-05-05 DIAGNOSIS — L710 Perioral dermatitis: Secondary | ICD-10-CM

## 2023-05-05 NOTE — Patient Instructions (Addendum)
Hello Khisha,  Thank you for visiting my office today. Your dedication to enhancing your skin health is greatly appreciated. Below is a summary of the essential instructions we covered during our consultation:    - Pimecrolimus Cream: apply pimecrolimus cream as needed. There is no specified timeframe for discontinuation.  - Aquaphor Application: Apply Aquaphor at night to form a moisture barrier, helping to prevent excessive irritation and dryness.  - Hyaluronic Acid: Use hyaluronic acid daily, both in the morning and at night, after washing your face to ensure hydration is maintained.  - Sunscreen: Continue utilizing Elta MD UV Clear, SPF 45, as your morning moisturizer with sunscreen.  - Future Considerations for Laser Treatment: Consider laser treatment for the prominent vessels and pinkness on your cheeks in the future, though it is not available at our clinic.  - Follow-Up Appointment: Schedule a follow-up appointment in six months to evaluate your condition's progress.  Please do not hesitate to reach out if you have any questions or concerns before our next meeting.  Best regards,  Dr. [facility]        Important Information  Due to recent changes in healthcare laws, you may see results of your pathology and/or laboratory studies on MyChart before the doctors have had a chance to review them. We understand that in some cases there may be results that are confusing or concerning to you. Please understand that not all results are received at the same time and often the doctors may need to interpret multiple results in order to provide you with the best plan of care or course of treatment. Therefore, we ask that you please give [place] 2 business days to thoroughly review all your results before contacting the office for clarification. Should we see a critical lab result, you will be contacted sooner.   If You Need Anything After Your Visit  If you have any  questions or concerns for your doctor, please call our main line at [phone] If no one answers, please leave a voicemail as directed and we will return your call as soon as possible. Messages left after 4 pm will be answered the following business day.   You may also send [place] a message via MyChart. We typically respond to MyChart messages within 1-2 business days.  For prescription refills, please ask your pharmacy to contact our office. Our fax number is [phone].  If you have an urgent issue when the clinic is closed that cannot wait until the next business day, you can page your doctor at the number below.    Please note that while we do our best to be available for urgent issues outside of office hours, we are not available 24/7.   If you have an urgent issue and are unable to reach [place], you may choose to seek medical care at your doctor's office, retail clinic, urgent care center, or emergency room.  If you have a medical emergency, please immediately call 911 or go to the emergency department. In the event of inclement weather, please call our main line at [phone] for an update on the status of any delays or closures.  Dermatology Medication Tips: Please keep the boxes that topical medications come in in order to help keep track of the instructions about where and how to use these. Pharmacies typically print the medication instructions only on the boxes and not directly on the medication tubes.   If your medication is too expensive, please contact our office at [phone]  or send [place] a message through MyChart.   We are unable to tell what your co-pay for medications will be in advance as this is different depending on your insurance coverage. However, we may be able to find a substitute medication at lower cost or fill out paperwork to get insurance to cover a needed medication.   If a prior authorization is required to get your medication covered by your insurance company,  please allow [place] 1-2 business days to complete this process.  Drug prices often vary depending on where the prescription is filled and some pharmacies may offer cheaper prices.  The website www.goodrx.com contains coupons for medications through different pharmacies. The prices here do not account for what the cost may be with help from insurance (it may be cheaper with your insurance), but the website can give you the price if you did not use any insurance.  - You can print the associated coupon and take it with your prescription to the pharmacy.  - You may also stop by our office during regular business hours and pick up a GoodRx coupon card.  - If you need your prescription sent electronically to a different pharmacy, notify our office through [facility] or by phone at [phone]

## 2023-05-05 NOTE — Progress Notes (Signed)
Right knee x-ray shows early arthritis especially underneath the kneecap.

## 2023-05-05 NOTE — Progress Notes (Signed)
   Follow-Up Visit   Subjective  Vanessa Daniels is a 47 y.o. female who presents for the following: perioral dermatitis  Patient present today for follow up visit for follow up. Patient was last evaluated on 03/03/23. Pt stated that occasional still gets a patch of dryness at the corner of the eye but she applies the pimecrolimus cream to the area and it heals. Patient reports sxs are better. Patient denies medication changes.  The following portions of the chart were reviewed this encounter and updated as appropriate: medications, allergies, medical history  Review of Systems:  No other skin or systemic complaints except as noted in HPI or Assessment and Plan.  Objective  Well appearing patient in no apparent distress; mood and affect are within normal limits.  A focused examination was performed of the following areas:   Relevant exam findings are noted in the Assessment and Plan.    Assessment & Plan    Perioral Dermatitis  - Assessment: Patient previously diagnosed with perioral dermatitis around the nose and eyes showed improvement after two weeks of treatment. Currently, reports persistent discoloration without inflammation. No active bumps, only discoloration observed.  - Plan:   - Discontinue clotrimazole betamethasone due to its excessive strength for facial use.   - Use pimecrolimus cream for flare-ups, without specifying a timeframe for usage.   - Apply Aquaphor at night to establish a moisture barrier.   - Incorporate hyaluronic acid into daily skincare, especially during winter months, to aid in hydration.   - In the morning, apply a moisturizer that includes sunscreen.   - At night, use a regular moisturizer without sunscreen.   - Continue the use of Elta MD UV Clear SPF 45 for facial protection.   - Schedule a follow-up appointment in six months to assess progress and adjust the treatment plan as necessary.  Follow-up as needed for any unresolved issues or  concerns.   No follow-ups on file.    Documentation: I have reviewed the above documentation for accuracy and completeness, and I agree with the above.   I, Shirron Marcha Solders, CMA, am acting as scribe for [employer], DO.   Langston Reusing, DO

## 2023-05-07 ENCOUNTER — Other Ambulatory Visit (HOSPITAL_BASED_OUTPATIENT_CLINIC_OR_DEPARTMENT_OTHER): Payer: Self-pay

## 2023-05-07 ENCOUNTER — Other Ambulatory Visit (HOSPITAL_COMMUNITY): Payer: Self-pay

## 2023-05-09 ENCOUNTER — Encounter: Payer: Self-pay | Admitting: Family Medicine

## 2023-05-12 ENCOUNTER — Ambulatory Visit
Admission: RE | Admit: 2023-05-12 | Discharge: 2023-05-12 | Disposition: A | Discharge status: Home / Self Care | Payer: Commercial Managed Care - PPO | Source: Ambulatory Visit | Attending: Family Medicine | Admitting: Family Medicine

## 2023-05-12 DIAGNOSIS — M25561 Pain in right knee: Secondary | ICD-10-CM | POA: Diagnosis not present

## 2023-05-12 NOTE — Telephone Encounter (Signed)
Forwarding to Dr. Corey to review and advise.  

## 2023-05-19 ENCOUNTER — Ambulatory Visit (INDEPENDENT_AMBULATORY_CARE_PROVIDER_SITE_OTHER): Payer: Self-pay | Admitting: Surgical

## 2023-05-19 DIAGNOSIS — Z411 Encounter for cosmetic surgery: Secondary | ICD-10-CM

## 2023-05-19 NOTE — Progress Notes (Signed)
Patient is a 47 year old female here to discuss some concerns she has related to downward turning of her lips, loss of volume of her jawline and facial rhytids.  She reports that she has had Botox and filler in the past, she has had filler of her nasolabial folds and her lateral lips here in our office.  She reports that she does not recall how the results were in the past, but I did review EMR notes which stated she did not feel as if she had much benefit from the filler in the lateral lips.  She reports that she is interested in options for improving the downward turning of her mouth and her jawline.  Discussed with patient options for treatment to these areas, we discussed filler to the lateral lips can help, but would likely not resolve her problem as this is related more to the DAO muscles pulling downward on the lateral mouth causing the downward turn.  We discussed that filler can be helpful, but may only provide minimal improvement.  We discussed also some surgical options including a facelift/mini facelift is an option, but she is not interested in pursuing any surgical interventions like this at that time.  We discussed options for Botox in the DAO muscles, but I discussed with the patient that I do not perform that procedure.  We did discuss that other places in the surrounding area may perform that, but I am not sure.  She opted for some Botox in her glabellar region to help with facial rhytids.  Botulinum Toxin Procedure Note  Procedure: Cosmetic botulinum toxin  Pre-operative Diagnosis: Dynamic rhytides  Post-operative Diagnosis: Same  Complications:  None  Brief history: The patient desires botulinum toxin injection.  She is aware of the risks including bleeding, damage to deeper structures, asymmetry, brow ptosis, eyelid ptosis, bruising. The patient understands and wishes to proceed.  Procedure: The area was prepped with alcohol and dried with a clean gauze.  Using a clean  technique the botulinum toxin was diluted with 2.5 mL of bacteriostatic saline per 100 unit vial which resulted in 4 units per 0.1 mL.  Subsequently the mixture was injected in the glabellar. A total of 10 Units of botulinum toxin was used. The glabellar area was injected with care to inject intramuscular only while holding pressure on the supratrochlear vessels in each area during each injection on either side of the medial corrugators.   No complications were noted. Light pressure was held for 5 minutes. She was instructed explicitly in post-operative care.  Botox LOT:  [id number] EXP:  03/2025

## 2023-05-26 NOTE — Progress Notes (Signed)
Right knee MRI shows mild arthritis and some areas of potholes in the cartilage underneath the kneecap some almost complete full-thickness.  This is where your pain is coming from very likely.  Fortunately no meniscus or ligament tears are present.  Recommend return to clinic after Christmas to talk about results and treatment plan and options in more detail.

## 2023-06-03 ENCOUNTER — Other Ambulatory Visit: Payer: Self-pay | Admitting: Gastroenterology

## 2023-06-03 DIAGNOSIS — R1032 Left lower quadrant pain: Secondary | ICD-10-CM

## 2023-06-03 MED ORDER — METRONIDAZOLE 500 MG PO TABS: 500.0000 mg | ORAL_TABLET | Freq: Two times a day (BID) | ORAL | 0 refills | Status: DC

## 2023-06-17 ENCOUNTER — Ambulatory Visit: Payer: Commercial Managed Care - PPO | Admitting: Family Medicine

## 2023-06-17 ENCOUNTER — Ambulatory Visit (INDEPENDENT_AMBULATORY_CARE_PROVIDER_SITE_OTHER): Payer: Commercial Managed Care - PPO

## 2023-06-17 ENCOUNTER — Other Ambulatory Visit: Payer: Self-pay

## 2023-06-17 VITALS — BP 138/86 | HR 82 | Ht 63.75 in | Wt 170.0 lb

## 2023-06-17 DIAGNOSIS — M25562 Pain in left knee: Secondary | ICD-10-CM

## 2023-06-17 DIAGNOSIS — M25561 Pain in right knee: Secondary | ICD-10-CM

## 2023-06-17 DIAGNOSIS — G8929 Other chronic pain: Secondary | ICD-10-CM

## 2023-06-17 NOTE — Patient Instructions (Addendum)
 Thank you for coming in today.   Please get an Xray today before you leave   Consider PT if not enough benefit from this shot.   Let me know how this goes.

## 2023-06-17 NOTE — Progress Notes (Signed)
 LILLETTE Vanessa Collet, PhD, LAT, ATC acting as a scribe for Vanessa Lloyd, MD.  Vanessa Daniels is a 48 y.o. female who presents to [facility] today for f/u R knee pain w/ MRI review. Pt was last seen by Dr. Lloyd on 04/14/23 and a MRI was ordered.   Today, pt reports R knee is still pretty painful. She notes now her L knee is also starting to hurt. Most pain after sitting and then trying to transition to stand. She has been wearing a knee brace and just got back from [place].    Dx imaging: 05/12/23 R knee MRI  04/14/23 R knee XR  Pertinent review of systems: No fevers or chills  Relevant historical information: Anxiety   Exam:  BP 138/86   Pulse 82   Ht 5' 3.75 (1.619 m)   Wt 170 lb (77.1 kg)   SpO2 98%   BMI 29.41 kg/m  General: Well Developed, well nourished, and in no acute distress.   MSK: Right knee no effusion normal motion with crepitation.  Left knee normal-appearing normal motion crepitation.  Stable ligamentous exam.    Lab and Radiology Results  Procedure: Real-time Ultrasound Guided Injection of right knee joint superior lateral patella space Device: Philips Affiniti 50G/GE Logiq Images permanently stored and available for review in PACS Verbal informed consent obtained.  Discussed risks and benefits of procedure. Warned about infection, bleeding, hyperglycemia damage to structures among others. Patient expresses understanding and agreement Time-out conducted.   Noted no overlying erythema, induration, or other signs of local infection.   Skin prepped in a sterile fashion.   Local anesthesia: Topical Ethyl chloride.   With sterile technique and under real time ultrasound guidance: 40 mg of Kenalog and 2 mL of Marcaine injected into knee joint. Fluid seen entering the joint capsule.   Completed without difficulty   Pain immediately resolved suggesting accurate placement of the medication.   Advised to call if fevers/chills,  erythema, induration, drainage, or persistent bleeding.   Images permanently stored and available for review in the ultrasound unit.  Impression: Technically successful ultrasound guided injection.   Procedure: Real-time Ultrasound Guided Injection of left knee joint superior lateral patella space Device: Philips Affiniti 50G/GE Logiq Images permanently stored and available for review in PACS Verbal informed consent obtained.  Discussed risks and benefits of procedure. Warned about infection, bleeding, hyperglycemia damage to structures among others. Patient expresses understanding and agreement Time-out conducted.   Noted no overlying erythema, induration, or other signs of local infection.   Skin prepped in a sterile fashion.   Local anesthesia: Topical Ethyl chloride.   With sterile technique and under real time ultrasound guidance: 40 mg of Kenalog and 2 mL of Marcaine injected into knee joint. Fluid seen entering the joint capsule.   Completed without difficulty   Pain immediately resolved suggesting accurate placement of the medication.   Advised to call if fevers/chills, erythema, induration, drainage, or persistent bleeding.   Images permanently stored and available for review in the ultrasound unit.  Impression: Technically successful ultrasound guided injection.   X-ray images left knee obtained today personally and independently interpreted Minimal patellofemoral DJD. Await formal radiology review   EXAM: MRI OF THE RIGHT KNEE WITHOUT CONTRAST   TECHNIQUE: Multiplanar, multisequence MR imaging of the knee was performed. No intravenous contrast was administered.   COMPARISON:  Radiographs 04/14/2023   FINDINGS: MENISCI   Medial meniscus:  Intact with normal morphology.   Lateral meniscus:  Intact with normal morphology.   LIGAMENTS   Cruciates: The anterior and posterior cruciate ligaments are intact.   Collaterals: The medial and lateral collateral ligament  complexes are intact.   CARTILAGE   Patellofemoral: Mild thinning of the patellar cartilage over the apex without full-thickness defect. Small nearly full-thickness chondral defect involving the medial trochlea, best seen on the sagittal images.   Medial:  The weight-bearing articular cartilage appears intact.   Lateral:  Preserved.   MISCELLANEOUS   Joint:  No significant joint effusion.   Popliteal Fossa: The popliteus muscle and tendon are intact. No significant Baker's cyst.   Extensor Mechanism: The visualized quadriceps and patellar tendons are intact.   Bones:  No acute or significant extra-articular osseous findings.   Other: Minimal subcutaneous edema medially. No focal periarticular fluid collections.   IMPRESSION: 1. Mild patellofemoral degenerative changes as described with small chondral defects. 2. The menisci, cruciate and collateral ligaments are intact. 3. No acute osseous findings.     Electronically Signed   By: Elsie Perone M.D.   On: 05/25/2023 16:51    Assessment and Plan: 48 y.o. female with bilateral anterior knee pain right worse than left.  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  This does correspond with her anterior knee pain.  Plan for steroid injection both knees today as she is now having left-sided pain as well.  If not significant improved consider adding physical therapy as well.   PDMP not reviewed this encounter. Orders Placed This Encounter  Procedures   US  LIMITED JOINT SPACE STRUCTURES LOW RIGHT(NO LINKED CHARGES)    Reason for Exam (SYMPTOM  OR DIAGNOSIS REQUIRED):   rigth knee pain    Preferred imaging location?:   [facility] Knee AP/LAT W/[place] Left    Standing Status:   Future    Number of Occurrences:   1    Expiration Date:   07/18/2023    Reason for Exam (SYMPTOM  OR DIAGNOSIS REQUIRED):   left knee pain    Preferred imaging  location?:   [facility]    Is patient pregnant?:   No   No orders of the defined types were placed in this encounter.    Discussed warning signs or symptoms. Please see discharge instructions. Patient expresses understanding.   The above documentation has been reviewed and is accurate and complete Vanessa Daniels, M.D.

## 2023-06-24 NOTE — Progress Notes (Signed)
Left knee x-ray shows mild arthritis underneath the kneecap.

## 2023-08-05 ENCOUNTER — Encounter: Payer: Self-pay | Admitting: Internal Medicine

## 2023-08-19 ENCOUNTER — Telehealth: Payer: Self-pay | Admitting: Physician Assistant

## 2023-08-19 NOTE — Telephone Encounter (Signed)
 Patient requesting sooner new patient appointment.  Please call and work patient in at her convenience - may override session limits, but please do not put back-to-back with any current new patients scheduled.  Thanks!  Sam

## 2023-09-02 ENCOUNTER — Other Ambulatory Visit (HOSPITAL_BASED_OUTPATIENT_CLINIC_OR_DEPARTMENT_OTHER): Payer: Self-pay

## 2023-09-02 ENCOUNTER — Encounter: Payer: Self-pay | Admitting: Physician Assistant

## 2023-09-02 ENCOUNTER — Ambulatory Visit (INDEPENDENT_AMBULATORY_CARE_PROVIDER_SITE_OTHER): Admitting: Physician Assistant

## 2023-09-02 VITALS — BP 120/74 | HR 68 | Temp 97.7°F | Ht 64.0 in | Wt 169.0 lb

## 2023-09-02 DIAGNOSIS — F419 Anxiety disorder, unspecified: Secondary | ICD-10-CM

## 2023-09-02 DIAGNOSIS — R002 Palpitations: Secondary | ICD-10-CM | POA: Diagnosis not present

## 2023-09-02 DIAGNOSIS — F32A Depression, unspecified: Secondary | ICD-10-CM | POA: Diagnosis not present

## 2023-09-02 MED ORDER — CITALOPRAM HYDROBROMIDE 10 MG PO TABS
10.0000 mg | ORAL_TABLET | Freq: Every day | ORAL | 1 refills | Status: DC
  Filled 2023-09-02: qty 30, 30d supply, fill #0
  Filled 2023-09-30: qty 30, 30d supply, fill #1

## 2023-09-02 NOTE — Progress Notes (Signed)
 Vanessa Daniels is a 48 y.o. female here to establish care.  History of Present Illness:   Chief Complaint  Patient presents with   Establish Care   Anxiety    Pt c/o increase in anxiety for the past year and some depression.   Palpitations    Pt c/o heart palpitations x 4 months, denies chest pain, no SOB or dizziness.    HPI  Prior PCP:  Dr. Sharlot Daniels  Relevant medical history: GYN health maintenance She is established with [facility].  Has an upcoming annual follow up.  Pt reports she is overdue for her mammogram, gets annually at [facility] and plans to schedule soon.  Acute concerns: Palpitations: Pt complains of intermittent episodes of palpitations and heart flutters. She describes these episodes as "butterflies in my heart".  Episodes tend to occur while she is at rest and mostly in the evenings.  Has not had episodes for some time.  She does drink coffee, but mostly drinks in the mornings.  She reports a fmhx of cardiac disorders (mother and uncle) and her son has hx of PVCs.   Chronic issues: Anxiety / Depression: Vanessa Daniels has a hx of anxiety and depression, which has recently increased.  She has a son with special needs related related to rare genetic deletion mutation. Endorses caregiver stress, stating she prioritizes her son's health over her own.  Has also had brain fog with some forgetfulness.  Has been about 10 years since doing talk therapy.  She recently joined an online support group for her son's rare genetic deletion mutation.  Took Lexapro when she was younger, with good clinical effect.  Prior to taking Lexapro, she was on Paxil in college.  She cannot recall why her Paxil was switched to Lexapro at the time.  More recently she was on Adderall and Vyvanse.  Stopped Vyvanse due to intolerances including chest pains and palpitations.  She adds she was very productive on Vyvanse.   He reports a hx of claustrophobia. Will take as  needed xanax for flying. She states this started while a plane; the idea of being restricted to an area unable to leave her seat is what triggered her at the time.   She reports breakthrough bleeding with her IUD.  Had her IUD placed about 5 years ago.  Has had prior IUDs, and also had breakthrough bleeding with prior IUDs.  Her breakthrough bleeding is consistent on a monthly basis.  States she bleeds on 2 days about a week apart.   Denies current suicidal ideation/hi.      09/02/2023   10:49 AM  GAD 7 : Generalized Anxiety Score  Nervous, Anxious, on Edge 2  Control/stop worrying 2  Worry too much - different things 2  Trouble relaxing 2  Restless 1  Easily annoyed or irritable 3  Afraid - awful might happen 2  Total GAD 7 Score 14  Anxiety Difficulty Not difficult at all      09/02/2023   10:49 AM 12/31/2022    2:07 PM 12/13/2021    8:43 AM 12/04/2020    3:05 PM 11/19/2019    1:42 PM  Depression screen PHQ 2/9  Decreased Interest 1 0 0 0 0  Down, Depressed, Hopeless 1 0 0 0 0  PHQ - 2 Score 2 0 0 0 0  Altered sleeping 2      Tired, decreased energy 2      Change in appetite 2  Feeling bad or failure about yourself  1      Trouble concentrating 3      Moving slowly or fidgety/restless 0      Suicidal thoughts 0      PHQ-9 Score 12      Difficult doing work/chores Somewhat difficult          Past Medical History:  Diagnosis Date   Anemia yrs ago   Anxiety    CIN 3 - cervical intraepithelial neoplasia grade 3    GERD (gastroesophageal reflux disease) 2012   Scoliosis    MILD DDD AND MILD SCOLIOSIS   Vertigo 03/28/2020   got really dizzy and anti nausea med helped     Social History   Tobacco Use   Smoking status: Former    Current packs/day: 0.00    Average packs/day: 0.3 packs/day for 16.0 years (4.0 ttl pk-yrs)    Types: Cigarettes    Start date: 06/10/2000    Quit date: 06/10/2016    Years since quitting: 7.2   Smokeless tobacco: Never  Vaping Use    Vaping status: Never Used  Substance Use Topics   Alcohol use: Not Currently    Comment: rare   Drug use: No    Past Surgical History:  Procedure Laterality Date   AUGMENTATION MAMMAPLASTY Bilateral 2008   CERVICAL CONIZATION W/BX N/A 03/31/2020   Procedure: CONIZATION CERVIX WITH BIOPSY with incidental IUD removal;  Surgeon: Vanessa Aspen, DO;  Location: [facility];  Service: Gynecology;  Laterality: N/A;  COLD KNIFE CONE   COLONOSCOPY  last done 8 yrs ago   LEEP  2005   Vanessa Daniels    Family History  Problem Relation Age of Onset   ADD / ADHD Mother    Hypertension Mother    Learning disabilities Mother    Vision loss Mother    Diabetes Father    Hearing loss Father    Hyperlipidemia Father    Alcohol abuse Maternal Grandmother    Asthma Maternal Grandmother    Brain cancer Maternal Grandmother    Lung cancer Maternal Grandmother    Lung cancer Maternal Grandfather    Diabetes Maternal Grandfather    Miscarriages / Stillbirths Paternal Grandmother    Prostate cancer Paternal Grandfather    ADD / ADHD Son    Learning disabilities Son    Breast cancer Neg Hx     Allergies  Allergen Reactions   Augmentin [Amoxicillin-Pot Clavulanate] Nausea And Vomiting    Current Medications:   Current Outpatient Medications:    albuterol (VENTOLIN HFA) 108 (90 Base) MCG/ACT inhaler, Inhale 2 puffs into the lungs every 6 (six) hours as needed for wheezing or shortness of breath., Disp: 8.5 g, Rfl: 0   ALPRAZolam (XANAX) 0.25 MG tablet, Take 1 tab (0.25mg ) 30 minutes before travel. Up to 2 doses per day., Disp: 20 tablet, Rfl: 1   citalopram (CELEXA) 10 MG tablet, Take 1 tablet (10 mg total) by mouth daily., Disp: 30 tablet, Rfl: 1   doxycycline (VIBRA-TABS) 100 MG tablet, Take 1 tablet (100 mg total) by mouth daily. With food and plenty of fluid, Disp: 60 tablet, Rfl: 1   hydrocortisone cream 1 %, Apply 1 application  topically as needed for itching., Disp: ,  Rfl:    levonorgestrel (MIRENA, 52 MG,) 20 MCG/DAY IUD, Mirena 20 mcg/24 hours (7 yrs) 52 mg intrauterine device  Take 1 device by intrauterine route., Disp: , Rfl:    pimecrolimus (ELIDEL) 1 %  cream, Apply topically daily. Apply to affected areas of rash of face, Disp: 30 g, Rfl: 2   valACYclovir (VALTREX) 1000 MG tablet, Take 1 tablet (1,000 mg total) by mouth 2 (two) times daily. (Patient taking differently: Take 1,000 mg by mouth 2 (two) times daily as needed.), Disp: 20 tablet, Rfl: 1   Review of Systems:   Negative unless otherwise specified per HPI.  Vitals:   Vitals:   09/02/23 1036  BP: 120/74  Pulse: 68  Temp: 97.7 F (36.5 C)  TempSrc: Temporal  SpO2: 94%  Weight: 169 lb (76.7 kg)  Height: 5\' 4"  (1.626 m)     Body mass index is 29.01 kg/m.  Physical Exam:   Physical Exam Vitals and nursing note reviewed.  Constitutional:      General: She is not in acute distress.    Appearance: She is well-developed. She is not ill-appearing or toxic-appearing.  Cardiovascular:     Rate and Rhythm: Normal rate and regular rhythm.     Pulses: Normal pulses.     Heart sounds: Normal heart sounds, S1 normal and S2 normal.  Pulmonary:     Effort: Pulmonary effort is normal.     Breath sounds: Normal breath sounds.  Skin:    General: Skin is warm and dry.  Neurological:     Mental Status: She is alert.     GCS: GCS eye subscore is 4. GCS verbal subscore is 5. GCS motor subscore is 6.  Psychiatric:        Mood and Affect: Mood is anxious. Affect is tearful.        Speech: Speech normal.        Behavior: Behavior normal. Behavior is cooperative.     Assessment and Plan:   Anxiety and depression Chronic, currently uncontrolled issue Reviewed risks and benefits/side effect(s) of ssri's -- we will trial 10 mg celexa daily We did discuss potential change to Trintellix due to concerns for weight gain Denies suicidal ideation/hi Encouraged talk therapy but she is resistant to  this at this time Continue to closely monitor Recommend follow up via MyChart in a month or so to let me know how she is doing with this medication, sooner if concerns Follow up in 4 months for Comprehensive Physical Exam (CPE) preventive care annual visit and we will complete blood work then, consider adding TSH  Palpitations Chronic, recently improved issue Declines work-up including EKG, heart monitor, blood work Barrister's clerk, she will continue to monitor symptom(s) Follow up in 4 months for Comprehensive Physical Exam (CPE) preventive care annual visit and we will complete blood work then, consider adding TSH  I, Isabelle Course, acting as a Neurosurgeon for [employer], [place]., have documented all relevant documentation on the behalf of Jarold Motto, [place], as directed by  Jarold Motto, PA while in the presence of Jarold Motto, [place].  I, Jarold Motto, [place], have reviewed all documentation for this visit. The documentation on 09/02/23 for the exam, diagnosis, procedures, and orders are all accurate and complete.  Jarold Motto, PA-C

## 2023-09-03 ENCOUNTER — Other Ambulatory Visit: Payer: Self-pay | Admitting: Physician Assistant

## 2023-09-03 DIAGNOSIS — Z1231 Encounter for screening mammogram for malignant neoplasm of breast: Secondary | ICD-10-CM

## 2023-09-03 NOTE — Progress Notes (Unsigned)
   Rubin Payor, PhD, LAT, ATC acting as a scribe for Vanessa Graham, MD.  Vanessa Daniels is a 48 y.o. female who presents to [facility] at [facility] today for L knee pain. Pt was last seen by Dr. Denyse Amass on 06/17/23 and was given bilat knee steroid injection.  Today, pt reports L knee pain started in January, progressively worsening. R knee is doing great. Pt locates pain to the anterior aspect of the L knee.   L Knee swelling: no Mechanical symptoms: no Aggravates: sitting, sleeping, long car rides Treatments tried: CSI, IBU  Dx imaging: 06/17/23 L knee XR  Pertinent review of systems: No fevers or chills  Relevant historical information: Otherwise healthy.   Exam:  BP 116/72   Pulse 76   Ht 5\' 4"  (1.626 m)   Wt 170 lb (77.1 kg)   SpO2 98%   BMI 29.18 kg/m  General: Well Developed, well nourished, and in no acute distress.   MSK: Left knee minimal effusion normal-appearing otherwise.  Nontender stable ligamentous exam intact strength.    Lab and Radiology Results  Procedure: Real-time Ultrasound Guided Injection of left knee joint superior lateral patella space Device: Philips Affiniti 50G/GE Logiq Images permanently stored and available for review in PACS Verbal informed consent obtained.  Discussed risks and benefits of procedure. Warned about infection, bleeding, hyperglycemia damage to structures among others. Patient expresses understanding and agreement Time-out conducted.   Noted no overlying erythema, induration, or other signs of local infection.   Skin prepped in a sterile fashion.   Local anesthesia: Topical Ethyl chloride.   With sterile technique and under real time ultrasound guidance: 40 mg of Kenalog and 2 mL of Marcaine injected into knee joint. Fluid seen entering the joint capsule.   Completed without difficulty   Pain immediately resolved suggesting accurate placement of the medication.   Advised to call if fevers/chills,  erythema, induration, drainage, or persistent bleeding.   Images permanently stored and available for review in the ultrasound unit.  Impression: Technically successful ultrasound guided injection.       Assessment and Plan: 48 y.o. female with chronic left knee pain.  Patient was seen about 3 months ago.  X-ray at that time was unremarkable.  Plan for repeat steroid injection today.  If not improved consider MRI.  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  It is possible she has something like that in her left knee.  Plan for trial of injection again today.  If not improved would recommend MRI of the left knee followed by rheumatologic workup if needed.   PDMP not reviewed this encounter. Orders Placed This Encounter  Procedures   [place] LIMITED JOINT SPACE STRUCTURES LOW LEFT(NO LINKED CHARGES)    Reason for Exam (SYMPTOM  OR DIAGNOSIS REQUIRED):   left knee pain    Preferred imaging location?:   [facility]   No orders of the defined types were placed in this encounter.    Discussed warning signs or symptoms. Please see discharge instructions. Patient expresses understanding.   The above documentation has been reviewed and is accurate and complete Vanessa Daniels, M.D.

## 2023-09-04 ENCOUNTER — Other Ambulatory Visit: Payer: Self-pay

## 2023-09-04 ENCOUNTER — Ambulatory Visit: Admitting: Family Medicine

## 2023-09-04 VITALS — BP 116/72 | HR 76 | Ht 64.0 in | Wt 170.0 lb

## 2023-09-04 DIAGNOSIS — M25562 Pain in left knee: Secondary | ICD-10-CM | POA: Diagnosis not present

## 2023-09-04 DIAGNOSIS — G8929 Other chronic pain: Secondary | ICD-10-CM

## 2023-09-04 NOTE — Patient Instructions (Signed)
 Thank you for coming in today.   You received an injection today. Seek immediate medical attention if the joint becomes red, extremely painful, or is oozing fluid.   If this shot doesn't work, send me a Wellsite geologist and I will order a MRI

## 2023-09-18 ENCOUNTER — Ambulatory Visit
Admission: RE | Admit: 2023-09-18 | Discharge: 2023-09-18 | Disposition: A | Discharge status: Home / Self Care | Source: Ambulatory Visit | Attending: Physician Assistant | Admitting: Physician Assistant

## 2023-09-18 DIAGNOSIS — Z1231 Encounter for screening mammogram for malignant neoplasm of breast: Secondary | ICD-10-CM

## 2023-10-16 ENCOUNTER — Encounter: Payer: Self-pay | Admitting: Dermatology

## 2023-10-16 ENCOUNTER — Ambulatory Visit: Payer: Commercial Managed Care - PPO | Admitting: Dermatology

## 2023-10-16 VITALS — BP 102/66 | HR 83

## 2023-10-16 DIAGNOSIS — D1801 Hemangioma of skin and subcutaneous tissue: Secondary | ICD-10-CM

## 2023-10-16 DIAGNOSIS — L578 Other skin changes due to chronic exposure to nonionizing radiation: Secondary | ICD-10-CM

## 2023-10-16 DIAGNOSIS — L814 Other melanin hyperpigmentation: Secondary | ICD-10-CM | POA: Diagnosis not present

## 2023-10-16 DIAGNOSIS — L719 Rosacea, unspecified: Secondary | ICD-10-CM

## 2023-10-16 DIAGNOSIS — Z1283 Encounter for screening for malignant neoplasm of skin: Secondary | ICD-10-CM | POA: Diagnosis not present

## 2023-10-16 DIAGNOSIS — D229 Melanocytic nevi, unspecified: Secondary | ICD-10-CM

## 2023-10-16 DIAGNOSIS — L710 Perioral dermatitis: Secondary | ICD-10-CM

## 2023-10-16 DIAGNOSIS — Z7189 Other specified counseling: Secondary | ICD-10-CM

## 2023-10-16 DIAGNOSIS — L821 Other seborrheic keratosis: Secondary | ICD-10-CM

## 2023-10-16 DIAGNOSIS — L820 Inflamed seborrheic keratosis: Secondary | ICD-10-CM | POA: Diagnosis not present

## 2023-10-16 DIAGNOSIS — W908XXA Exposure to other nonionizing radiation, initial encounter: Secondary | ICD-10-CM | POA: Diagnosis not present

## 2023-10-16 DIAGNOSIS — Z808 Family history of malignant neoplasm of other organs or systems: Secondary | ICD-10-CM

## 2023-10-16 MED ORDER — IVERMECTIN 1 % EX CREA: 1.0000 | TOPICAL_CREAM | Freq: Every day | CUTANEOUS | 6 refills | Status: AC

## 2023-10-16 NOTE — Progress Notes (Signed)
 Total Body Skin Exam (TBSE) Visit   Subjective  Vanessa Daniels is a 48 y.o. female who presents for the following: Skin Cancer Screening and Full Body Skin Exam  Patient presents today for follow up visit for TBSE. Patient was last evaluated on 05/05/23. Patient denies medication changes. Patient reports she does not have spots, moles and lesions of concern to be evaluated. Patient reports throughout her lifetime she has had severe sun exposure. Currently, patient reports if she has excessive sun exposure, she does apply sunscreen and/or wears protective coverings. Patient reports she does not have hx of bx. Patient reports  family history of skin cancers (Mom). The patient has spots, moles and lesions to be evaluated, some may be new or changing and the patient has concerns that these could be cancer.  The following portions of the chart were reviewed this encounter and updated as appropriate: medications, allergies, medical history  Review of Systems:  No other skin or systemic complaints except as noted in HPI or Assessment and Plan.  Objective  Well appearing patient in no apparent distress; mood and affect are within normal limits.  A full examination was performed including scalp, head, eyes, ears, nose, lips, neck, chest, axillae, abdomen, back, buttocks, bilateral upper extremities, bilateral lower extremities, hands, feet, fingers, toes, fingernails, and toenails. All findings within normal limits unless otherwise noted below.   Relevant physical exam findings are noted in the Assessment and Plan.  Left Thigh - Anterior, Left Upper Back Symptomatic, irritating, patient would like treated.  Benign-appearing.  Call clinic for new or changing lesions.    Assessment & Plan   LENTIGINES, SEBORRHEIC KERATOSES, HEMANGIOMAS - Benign normal skin lesions - Benign-appearing - Call for any changes  MELANOCYTIC NEVI - Tan-brown and/or pink-flesh-colored symmetric macules and  papules - Benign appearing on exam today - Observation - Call clinic for new or changing moles - Recommend daily use of broad spectrum spf 30+ sunscreen to sun-exposed areas.   MILD ACTINIC DAMAGE - Chronic condition, secondary to cumulative UV/sun exposure - diffuse scaly erythematous macules with underlying dyspigmentation - Recommend daily broad spectrum sunscreen SPF 30+ to sun-exposed areas, reapply every 2 hours as needed.  - Staying in the shade or wearing long sleeves, sun glasses (UVA+UVB protection) and wide brim hats (4-inch brim around the entire circumference of the hat) are also recommended for sun protection.  - Call for new or changing lesions.  . Perioral dermatitis Feliberto Hopping - Assessment: Pink papules on the cheek and perioral area, consistent with perioral dermatitis and rosacea. Current management includes sporadic use of doxycycline  for flares and daily application of pimecrolimus . Patient reports recent discontinuation of doxycycline  prior to sun exposure during vacation. Demodex mites are suspected as a potential trigger for the condition.  - Plan:    Add ivermectin (Soolantra) cream to current regimen     - Apply ivermectin and pimecrolimus  creams together twice daily to affected areas    Continue doxycycline  for severe flares only, once or twice daily for up to one week    Prescribe Cicaplast (calming restoring balm) for irritation    Apply Aquaphor to affected areas at night    Avoid saliva contact with affected areas    Follow up in 6 months for rosacea/perioral dermatitis evaluation  Sun protection - Assessment: Patient reports recent sun exposure during vacation in the [place]. Current sunscreen use is inadequate, particularly in high-UV environments. - Plan:    Educate on importance of mineral sunscreens containing zinc  oxide and titanium dioxide    Provide samples of Neutrogena mineral sunscreens    Instruct to look for zinc oxide and titanium dioxide in  sunscreen ingredients  SKIN CANCER SCREENING PERFORMED TODAY.  PERIORAL DERMATITIS   Related Medications Ivermectin 1 % CREA Apply 1 Application topically daily. INFLAMED SEBORRHEIC KERATOSIS (2) Left Thigh - Anterior, Left Upper Back Destruction of lesion - Left Thigh - Anterior, Left Upper Back Complexity: simple   Destruction method: cryotherapy   Informed consent: discussed and consent obtained   Timeout:  patient name, date of birth, surgical site, and procedure verified Lesion destroyed using liquid nitrogen: Yes   Cryotherapy cycles:  2 Post-procedure details: wound care instructions given    Return in about 6 months (around 04/17/2024) for Rosacea with Perioral Dermatitis F/U. 1 yr for TBSE  I, Jetta Ager, am acting as Neurosurgeon for [employer], DO.  Documentation: I have reviewed the above documentation for accuracy and completeness, and I agree with the above.  Louana Roup, DO

## 2023-10-16 NOTE — Patient Instructions (Addendum)
 Hello Eshika,  Thank you for visiting today. Here is a summary of the key instructions:  Medications: - Use ivermectin (Soolantra) cream daily - Apply pimecrolimus  cream daily - Mix both creams and apply twice a day - Use doxycycline  once or twice a day for severe flares, up to a week  Skin Care: - Apply Aquaphor at night to cheek and around mouth - Use Cicplast balm to help heal irritation - Use mineral sunscreen with zinc oxide and titanium dioxide - Apply Aquaphor to help heal frozen spots faster  Follow-up: - Return in 6 months for rosacea/perioral dermatitis follow-up  Samples Provided: - Neutrogena mineral sunscreens  Please reach out if you have any questions or concerns.  Warm regards,  Dr. Louana Roup Dermatology     Cryotherapy Aftercare  Wash gently with soap and water everyday.   Apply Vaseline and Band-Aid daily until healed.   Important Information   Due to recent changes in healthcare laws, you may see results of your pathology and/or laboratory studies on MyChart before the doctors have had a chance to review them. We understand that in some cases there may be results that are confusing or concerning to you. Please understand that not all results are received at the same time and often the doctors may need to interpret multiple results in order to provide you with the best plan of care or course of treatment. Therefore, we ask that you please give us  2 business days to thoroughly review all your results before contacting the office for clarification. Should we see a critical lab result, you will be contacted sooner.     If You Need Anything After Your Visit   If you have any questions or concerns for your doctor, please call our main line at [phone]. If no one answers, please leave a voicemail as directed and we will return your call as soon as possible. Messages left after 4 pm will be answered the following business day.    You may also send us  a  message via MyChart. We typically respond to MyChart messages within 1-2 business days.  For prescription refills, please ask your pharmacy to contact our office. Our fax number is [phone].  If you have an urgent issue when the clinic is closed that cannot wait until the next business day, you can page your doctor at the number below.     Please note that while we do our best to be available for urgent issues outside of office hours, we are not available 24/7.    If you have an urgent issue and are unable to reach us , you may choose to seek medical care at your doctor's office, retail clinic, urgent care center, or emergency room.   If you have a medical emergency, please immediately call 911 or go to the emergency department. In the event of inclement weather, please call our main line at [phone] for an update on the status of any delays or closures.  Dermatology Medication Tips: Please keep the boxes that topical medications come in in order to help keep track of the instructions about where and how to use these. Pharmacies typically print the medication instructions only on the boxes and not directly on the medication tubes.   If your medication is too expensive, please contact our office at [phone] or send us  a message through MyChart.    We are unable to tell what your co-pay for medications will be in advance as this is different depending  on your insurance coverage. However, we may be able to find a substitute medication at lower cost or fill out paperwork to get insurance to cover a needed medication.    If a prior authorization is required to get your medication covered by your insurance company, please allow us  1-2 business days to complete this process.   Drug prices often vary depending on where the prescription is filled and some pharmacies may offer cheaper prices.   The website www.goodrx.com contains coupons for medications through different pharmacies. The  prices here do not account for what the cost may be with help from insurance (it may be cheaper with your insurance), but the website can give you the price if you did not use any insurance.  - You can print the associated coupon and take it with your prescription to the pharmacy.  - You may also stop by our office during regular business hours and pick up a GoodRx coupon card.  - If you need your prescription sent electronically to a different pharmacy, notify our office through [facility] or by phone at [phone]

## 2023-10-22 ENCOUNTER — Ambulatory Visit: Payer: Commercial Managed Care - PPO | Admitting: Dermatology

## 2023-10-31 ENCOUNTER — Other Ambulatory Visit: Payer: Self-pay | Admitting: Physician Assistant

## 2023-10-31 ENCOUNTER — Encounter: Payer: Self-pay | Admitting: Physician Assistant

## 2023-10-31 ENCOUNTER — Other Ambulatory Visit (HOSPITAL_BASED_OUTPATIENT_CLINIC_OR_DEPARTMENT_OTHER): Payer: Self-pay

## 2023-10-31 MED ORDER — CITALOPRAM HYDROBROMIDE 10 MG PO TABS
10.0000 mg | ORAL_TABLET | Freq: Every day | ORAL | 1 refills | Status: DC
  Filled 2023-10-31: qty 90, 90d supply, fill #0

## 2023-11-06 ENCOUNTER — Other Ambulatory Visit (HOSPITAL_BASED_OUTPATIENT_CLINIC_OR_DEPARTMENT_OTHER): Payer: Self-pay

## 2023-11-06 MED ORDER — CYCLOBENZAPRINE HCL 10 MG PO TABS
10.0000 mg | ORAL_TABLET | Freq: Three times a day (TID) | ORAL | 0 refills | Status: AC | PRN
Start: 2023-11-06 — End: ?
  Filled 2023-11-06: qty 60, 20d supply, fill #0

## 2023-11-06 NOTE — Telephone Encounter (Signed)
 She strained her back

## 2023-11-18 ENCOUNTER — Ambulatory Visit: Admitting: Physician Assistant

## 2023-12-29 ENCOUNTER — Other Ambulatory Visit: Payer: Self-pay

## 2023-12-29 ENCOUNTER — Other Ambulatory Visit (HOSPITAL_BASED_OUTPATIENT_CLINIC_OR_DEPARTMENT_OTHER): Payer: Self-pay

## 2023-12-29 ENCOUNTER — Ambulatory Visit (INDEPENDENT_AMBULATORY_CARE_PROVIDER_SITE_OTHER)

## 2023-12-29 ENCOUNTER — Ambulatory Visit: Admitting: Family Medicine

## 2023-12-29 ENCOUNTER — Encounter: Payer: Self-pay | Admitting: Family Medicine

## 2023-12-29 VITALS — BP 108/80 | HR 103 | Ht 64.0 in | Wt 170.0 lb

## 2023-12-29 DIAGNOSIS — G8929 Other chronic pain: Secondary | ICD-10-CM | POA: Diagnosis not present

## 2023-12-29 DIAGNOSIS — M25551 Pain in right hip: Secondary | ICD-10-CM

## 2023-12-29 DIAGNOSIS — M545 Low back pain, unspecified: Secondary | ICD-10-CM | POA: Diagnosis not present

## 2023-12-29 DIAGNOSIS — M5442 Lumbago with sciatica, left side: Secondary | ICD-10-CM

## 2023-12-29 DIAGNOSIS — M25552 Pain in left hip: Secondary | ICD-10-CM

## 2023-12-29 DIAGNOSIS — M25562 Pain in left knee: Secondary | ICD-10-CM | POA: Diagnosis not present

## 2023-12-29 MED ORDER — MELOXICAM 15 MG PO TABS
15.0000 mg | ORAL_TABLET | Freq: Every day | ORAL | 0 refills | Status: DC
Start: 2023-12-29 — End: 2024-01-14
  Filled 2023-12-29: qty 30, 30d supply, fill #0

## 2023-12-29 MED ORDER — METHYLPREDNISOLONE ACETATE 80 MG/ML IJ SUSP
80.0000 mg | Freq: Once | INTRAMUSCULAR | Status: AC
  Administered 2023-12-29: 80 mg via INTRAMUSCULAR

## 2023-12-29 MED ORDER — KETOROLAC TROMETHAMINE 60 MG/2ML IM SOLN
60.0000 mg | Freq: Once | INTRAMUSCULAR | Status: AC
  Administered 2023-12-29: 60 mg via INTRAMUSCULAR

## 2023-12-29 NOTE — Patient Instructions (Addendum)
 Thank you for coming in today.   Intra-muscular Methylprednisolone  and Ketorolac  today. You received an injection today. Seek immediate medical attention if the joint becomes red, extremely painful, or is oozing fluid.   I've sent a prescription for Meloxicam  to your pharmacy.   Please get an Xray today before you leave   You should hear from MRI scheduling within 1 week. If you do not hear please let me know.    I've referred you to Physical Therapy.  Let us  know if you don't hear from them in one week.   Check back with Dr. Leonce 1 week prior to your vacation

## 2023-12-29 NOTE — Progress Notes (Signed)
 I, Leotis Batter, CMA acting as a scribe for Artist Lloyd, MD.  Vanessa Daniels is a 48 y.o. female who presents to [facility] at [facility] today for exacerbation of her L knee pain. Pt was last seen by Dr. Lloyd on 09/04/23 and was given a L knee steroid injection  Today, pt reports some improvement of knee sx with last injection, sx never completely resolved. Also c/o catching in the hips and exacerbation of lower back.sx. Denies mechanical sx of the left knee. C/O constant dull pain at lateral aspect of the knee. Some swelling present. Has tried IBU and Tylenol with no relief. Sx causing night disturbance. Short-term relief with heat. Denies new injury to the knee.   Dx imaging: 06/17/23 L knee XR   Pertinent review of systems: No fevers or chills  Relevant historical information: Anxiety otherwise healthy   Exam:  BP 108/80   Pulse (!) 103   Ht 5' 4 (1.626 m)   Wt 170 lb (77.1 kg)   SpO2 98%   BMI 29.18 kg/m  General: Well Developed, well nourished, and in no acute distress.   MSK: Left knee normal-appearing Normal motion. Tender palpation anterior and lateral joint line. No laxity to LCL stress test. Positive lateral Murray's test. Intact strength.  Hips bilaterally normal.  Normal motion intact strength.  L-spine normal lumbar motion.    Lab and Radiology Results  X-ray images lumbar spine and bilateral hips obtained today personally and independently interpreted.  Lumbar spine: Mild DDD and mild to moderate facet DJD L5-S1.  No acute fractures are visible.  Bilateral hip: No acute fractures.  Minimal degenerative changes bilateral hips.  Await formal radiology review    Assessment and Plan: 48 y.o. female with chronic left knee pain.  Patient has only minimal degenerative changes on x-ray.  She only had 1 week of relief with a previous steroid injection.  We do not understand her knee pain well enough.  With her positive Murray's test  and pain at the lateral joint line I am very concerned about a lateral meniscus tear.  Plan for MRI to further clarify source of pain and for potential surgical planning.  IT band syndrome is on the differential diagnosis and could be treated with injection if shown to be the case on MRI.  Additionally she has bilateral anterior hip pain and clicking sensation and chronic low back pain.  This could be internal snapping hip syndrome or even a labrum tear.  Plan for physical therapy referral and x-ray.  Consider MRI arthrogram in the future if needed.  She is going to vacation in late August.  Recommend scheduling with one of my colleagues about a week prior to that trip to consider a left knee injection.  Provided a Depo-Medrol  and Toradol  injection IM prior to discharge.  Additionally prescribe meloxicam  to use as needed.  PDMP not reviewed this encounter. Orders Placed This Encounter  Procedures   DG HIPS BILAT W OR W/O PELVIS MIN 5 VIEWS    Standing Status:   Future    Number of Occurrences:   1    Expiration Date:   12/28/2024    Reason for Exam (SYMPTOM  OR DIAGNOSIS REQUIRED):   bilat hip pain    Is patient pregnant?:   No    Preferred imaging location?:   [facility]   DG Lumbar Spine 2-3 Views    Standing Status:   Future    Number of Occurrences:  1    Expiration Date:   01/29/2024    Reason for Exam (SYMPTOM  OR DIAGNOSIS REQUIRED):   low back pain    Preferred imaging location?:    [facility]    Is patient pregnant?:   No   MR Knee Left  Wo Contrast    Standing Status:   Future    Expiration Date:   12/28/2024    What is the patient's sedation requirement?:   No Sedation    Does the patient have a pacemaker or implanted devices?:   No    Preferred imaging location?:   [facility]   Ambulatory referral to Physical Therapy    Referral Priority:   Routine    Referral Type:   Physical Medicine    Referral Reason:   Specialty Services Required    Requested  Specialty:   Physical Therapy    Number of Visits Requested:   1   Meds ordered this encounter  Medications   meloxicam  (MOBIC ) 15 MG tablet    Sig: Take 1 tablet (15 mg total) by mouth daily.    Dispense:  30 tablet    Refill:  0   methylPREDNISolone  acetate (DEPO-MEDROL ) injection 80 mg   ketorolac  (TORADOL ) injection 60 mg     Discussed warning signs or symptoms. Please see discharge instructions. Patient expresses understanding.   The above documentation has been reviewed and is accurate and complete Artist Lloyd, M.D.

## 2023-12-30 ENCOUNTER — Ambulatory Visit: Attending: Family Medicine | Admitting: Rehabilitative and Restorative Service Providers"

## 2023-12-30 ENCOUNTER — Encounter: Payer: Self-pay | Admitting: Rehabilitative and Restorative Service Providers"

## 2023-12-30 ENCOUNTER — Other Ambulatory Visit: Payer: Self-pay

## 2023-12-30 DIAGNOSIS — G8929 Other chronic pain: Secondary | ICD-10-CM | POA: Diagnosis not present

## 2023-12-30 DIAGNOSIS — R252 Cramp and spasm: Secondary | ICD-10-CM | POA: Insufficient documentation

## 2023-12-30 DIAGNOSIS — M6281 Muscle weakness (generalized): Secondary | ICD-10-CM | POA: Insufficient documentation

## 2023-12-30 DIAGNOSIS — M545 Low back pain, unspecified: Secondary | ICD-10-CM | POA: Diagnosis not present

## 2023-12-30 DIAGNOSIS — M25562 Pain in left knee: Secondary | ICD-10-CM | POA: Diagnosis not present

## 2023-12-30 DIAGNOSIS — R262 Difficulty in walking, not elsewhere classified: Secondary | ICD-10-CM | POA: Insufficient documentation

## 2023-12-30 NOTE — Therapy (Signed)
 OUTPATIENT PHYSICAL THERAPY LOWER EXTREMITY EVALUATION   Patient Name: Vanessa Daniels MRN: [id number] DOB:1975/09/20, 48 y.o., female Today's Date: 12/30/2023  END OF SESSION:  PT End of Session - 12/30/23 1024     Visit Number 1    Date for PT Re-Evaluation 02/20/24    Authorization Type [employer]    PT Start Time 1015    PT Stop Time 1050    PT Time Calculation (min) 35 min    Activity Tolerance Patient tolerated treatment well    Behavior During Therapy [facility] for tasks assessed/performed          Past Medical History:  Diagnosis Date   Anemia yrs ago   Anxiety    CIN 3 - cervical intraepithelial neoplasia grade 3    GERD (gastroesophageal reflux disease) 2012   Scoliosis    MILD DDD AND MILD SCOLIOSIS   Vertigo 03/28/2020   got really dizzy and anti nausea med helped   Past Surgical History:  Procedure Laterality Date   AUGMENTATION MAMMAPLASTY Bilateral 2008   CERVICAL CONIZATION W/BX N/A 03/31/2020   Procedure: CONIZATION CERVIX WITH BIOPSY with incidental IUD removal;  Surgeon: Lilton Legions, DO;  Location: [facility];  Service: Gynecology;  Laterality: N/A;  COLD KNIFE CONE   COLONOSCOPY  last done 8 yrs ago   LEEP  2005   J. MATTHEWS   Patient Active Problem List   Diagnosis Date Noted   History of herpes labialis 11/19/2019   Anxiety 11/19/2019   CIN III (cervical intraepithelial neoplasia III) 04/07/2012    PCP: Job Lukes, PA  REFERRING PROVIDER: Joane Artist RAMAN, MD  REFERRING DIAG: [phone] (ICD-10-CM) - Chronic pain of left knee M54.42,G89.29 (ICD-10-CM) - Chronic left-sided low back pain with left-sided sciatica  THERAPY DIAG:  Left knee pain, unspecified chronicity  Low back pain, unspecified back pain laterality, unspecified chronicity, unspecified whether sciatica present  Muscle weakness (generalized)  Cramp and spasm  Difficulty in walking, not elsewhere classified  Rationale for Evaluation  and Treatment: Rehabilitation  ONSET DATE: 8 months ago, but getting worse  SUBJECTIVE:   SUBJECTIVE STATEMENT: Patient states that her knee pain has been hurting for the past 8 months, states that her right knee is doing better after the injection, but her left knee is still hurting more.  She states that the past 2-3 months she has started having some back pain, as well.  Pt reports that she danced from 4th grade to her senior year of HS, so she suspects that is where some of the damage to her joints occurred.  PERTINENT HISTORY: OA  PAIN:  Are you having pain? Yes: NPRS scale: 9/10 for knee, 4-10 for low back Pain location: left knee and low back Pain description: dull constant ache for knee, and soreness in her back Aggravating factors: prolonged time on her feet Relieving factors: medication  PRECAUTIONS: None  RED FLAGS: None   WEIGHT BEARING RESTRICTIONS: No  FALLS:  Has patient fallen in last 6 months? 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  NEXT MD VISIT: Dr Leonce on 02/02/24  OBJECTIVE:  Note: Objective measures were completed at Evaluation unless otherwise noted.  DIAGNOSTIC FINDINGS:  Per MD notes on 12/29/23: X-ray images lumbar spine and bilateral hips obtained today personally and independently interpreted.   Lumbar spine: Mild DDD and mild to moderate facet DJD L5-S1.  No acute fractures are visible.   Bilateral hip: No acute fractures.  Minimal degenerative changes bilateral hips.   Await formal radiology review  PATIENT SURVEYS:  LEFS  Extreme difficulty/unable (0), Quite a bit of difficulty (1), Moderate  difficulty (2), Little difficulty (3), No difficulty (4) Survey date:  12/30/23  Any of your usual work, housework or school activities 2  2. Usual hobbies, recreational or sporting activities 2  3. Getting into/out of the bath 4  4. Walking between rooms 4  5. Putting on socks/shoes 3  6. Squatting  1  7. Lifting an object, like a bag of groceries from the floor 2  8. Performing light activities around your home 4  9. Performing heavy activities around your home 2  10. Getting into/out of a car 1  11. Walking 2 blocks 4  12. Walking 1 mile 2  13. Going up/down 10 stairs (1 flight) 1  14. Standing for 1 hour 1  15.  sitting for 1 hour 1  16. Running on even ground 1  17. Running on uneven ground 0  18. Making sharp turns while running fast 0  19. Hopping  0  20. Rolling over in bed 2  Score total:  37/80 = 46.25%     COGNITION: Overall cognitive status: Within functional limits for tasks assessed     SENSATION: Patient denies numbness and tingling   MUSCLE LENGTH: Hamstrings: minimal tightness  POSTURE: rounded shoulders and forward head  PALPATION: Tender to palpation, especially on left knee  LOWER EXTREMITY ROM:  Eval:  WFL  LOWER EXTREMITY MMT:  Eval: Right hip/knee is WFL Left hip/knee is grossly 4/5 with slight pain  LOWER EXTREMITY SPECIAL TESTS:  Knee special tests: McMurray's test: positive   FUNCTIONAL TESTS:  Eval: 5 times sit to stand: 11.85 sec with decreased weight bearing through left leg Single leg stance:  right- >30 sec, left- 29.70 sec with increased sway, especially at the end  GAIT: Distance walked: >500 ft Assistive device utilized: None Level of assistance: Complete Independence Comments: 30 minutes before pain starts                                                                                                                                TREATMENT DATE:  12/30/2023: Arletta and reviewed HEP Discussed role of PT     PATIENT EDUCATION:  Education details: Issued HEP and provided with green theraband Person educated: Patient Education method: Explanation, Demonstration, and Handouts Education comprehension: verbalized understanding and returned demonstration  HOME EXERCISE PROGRAM: Access Code: [email or web address] URL: [email or web address] Date: 12/30/2023 Prepared by: Jarrell Laming  Exercises - Seated Scapular Retraction  - 1 x daily - 7 x weekly - 2 sets - 10 reps - Seated  Long Arc Quad  - 1 x daily - 7 x weekly - 1-2 sets - 10 reps - Seated Hamstring Stretch  - 1 x daily - 7 x weekly - 2 reps - 20 sec hold - Hooklying Clamshell with Resistance  - 1 x daily - 7 x weekly - 2 sets - 10 reps - Supine March with Resistance Band  - 1 x daily - 7 x weekly - 2 sets - 10 reps  ASSESSMENT:  CLINICAL IMPRESSION: Patient is a 48 y.o. female who was seen today for physical therapy evaluation and treatment for left knee pain and left sided back pain. Patient states that she is having some increased pain with prolonged walking, such as when she was recently in [facility].  Patient reports that she has difficulty with navigating her stairs at home and must heavily rely on the handrail.  Patient was able to schedule a MRI for later this week to rule out any possible meniscus tear.  Patient presents to PT with increased knee and back pain, difficulty walking, muscle weakness, decreased balance, and difficulty performing various functional tasks.  Patient with decreased weight bearing noted through left LE during sit to/from stand.  Patient would benefit from skilled PT to allow her to perform her desired tasks without increased pain or difficulty.  OBJECTIVE IMPAIRMENTS: decreased balance, difficulty walking, decreased strength, increased muscle spasms, impaired flexibility, postural dysfunction, and pain.   ACTIVITY LIMITATIONS: lifting, bending, sitting, standing, squatting, and stairs  PARTICIPATION  LIMITATIONS: community activity and yard work  PERSONAL FACTORS: 1 comorbidity: OA are also affecting patient's functional outcome.   REHAB POTENTIAL: Good  CLINICAL DECISION MAKING: Stable/uncomplicated  EVALUATION COMPLEXITY: Low   GOALS: Goals reviewed with patient? Yes  SHORT TERM GOALS: Target date: 01/23/2024 Patient will be independent with initial HEP. Baseline: Goal status: INITIAL  2.  Patient will report at least a 25% improvement in symptoms since starting PT. Baseline:  Goal status: INITIAL   LONG TERM GOALS: Target date: 02/20/2024  Patient will be independent with advanced HEP. Baseline:  Goal status: INITIAL  2.  Patient will improve Lower Extremity Functional Scale to at least 60% to demonstrate improvement in functional tasks. Baseline: 46.25% Goal status: INITIAL  3.  Patient to increase left hip/knee strength to [facility] to allow her to navigate stairs at home with reciprocal pattern with increased ease. Baseline: pt reports difficulty and heavy reliance on rail Goal status: INITIAL  4.  Patient will increase left single leg stance to greater than 30 seconds without increased sway to decrease risk of falling, Baseline: 29.70 sec with increased sway Goal status: INITIAL  5.  Patient will be able to perform 5 times sit to stand in less than 10 seconds with equal weight bearing between LE to demonstrate improved functional strength. Baseline: 11.85 sec with decreased weight bearing through left leg Goal status: INITIAL   PLAN:  PT FREQUENCY: 1-2x/week  PT DURATION: 8 weeks  PLANNED INTERVENTIONS: 97164- PT Re-evaluation, 97750- Physical Performance Testing, 97110-Therapeutic exercises, 97530- Therapeutic activity, [id number]- Neuromuscular re-education, 97535- Self Care, [id number]- Manual therapy, [id number]- Gait training, [phone]- Canalith repositioning, [id number]- Aquatic Therapy, H9716- Electrical stimulation (unattended), [id number]- Electrical stimulation (manual), [id number]-  Vasopneumatic device, [id number]- Ultrasound, [id number]- Traction (mechanical), [id number]- Ionotophoresis 4mg /ml Dexamethasone , 79439 (1-2 muscles), 20561 (3+ muscles)- Dry Needling, Patient/Family education, Balance training, Stair training, Taping, Joint mobilization, Joint manipulation, Spinal manipulation, Spinal mobilization, Vestibular training, Cryotherapy, and Moist heat  PLAN FOR NEXT SESSION: Assess and progress HEP as  indicated, strengthening, flexibility, manual/dry needling as indicated    Jarrell Laming, PT, DPT 12/30/23, 12:59 PM  [facility] [address] [place], [place] [address] Phone # [phone] Fax [phone]

## 2024-01-02 ENCOUNTER — Ambulatory Visit
Admission: RE | Admit: 2024-01-02 | Discharge: 2024-01-02 | Disposition: A | Discharge status: Home / Self Care | Source: Ambulatory Visit | Attending: Family Medicine | Admitting: Family Medicine

## 2024-01-02 DIAGNOSIS — G8929 Other chronic pain: Secondary | ICD-10-CM

## 2024-01-02 DIAGNOSIS — M25462 Effusion, left knee: Secondary | ICD-10-CM | POA: Diagnosis not present

## 2024-01-05 ENCOUNTER — Ambulatory Visit: Payer: Self-pay | Admitting: Family Medicine

## 2024-01-05 DIAGNOSIS — G8929 Other chronic pain: Secondary | ICD-10-CM

## 2024-01-05 NOTE — Progress Notes (Signed)
 Lumbar spine x-ray shows no acute findings.  There is a very tiny bit of curvature which is either due to muscle spasm or a little bit of scoliosis.

## 2024-01-05 NOTE — Progress Notes (Signed)
 Bilateral hip x-ray looks okay to radiology.

## 2024-01-05 NOTE — Telephone Encounter (Signed)
 Referral has been placed.

## 2024-01-05 NOTE — Progress Notes (Signed)
 Knee MRI shows some inflammation at the lateral aspect of the knee.  We could try a targeted injection in that area or iontophoresis with physical therapy which could be very helpful.  I would recommend iontophoresis with physical therapy first.

## 2024-01-13 ENCOUNTER — Encounter: Payer: Commercial Managed Care - PPO | Admitting: Family Medicine

## 2024-01-14 ENCOUNTER — Ambulatory Visit: Attending: Family Medicine

## 2024-01-14 ENCOUNTER — Other Ambulatory Visit (HOSPITAL_BASED_OUTPATIENT_CLINIC_OR_DEPARTMENT_OTHER): Payer: Self-pay

## 2024-01-14 ENCOUNTER — Encounter: Payer: Self-pay | Admitting: Physician Assistant

## 2024-01-14 ENCOUNTER — Ambulatory Visit (INDEPENDENT_AMBULATORY_CARE_PROVIDER_SITE_OTHER): Admitting: Physician Assistant

## 2024-01-14 ENCOUNTER — Ambulatory Visit: Payer: Self-pay | Admitting: Physician Assistant

## 2024-01-14 VITALS — BP 110/76 | HR 77 | Temp 98.4°F | Ht 64.0 in | Wt 167.2 lb

## 2024-01-14 DIAGNOSIS — Z1211 Encounter for screening for malignant neoplasm of colon: Secondary | ICD-10-CM

## 2024-01-14 DIAGNOSIS — F419 Anxiety disorder, unspecified: Secondary | ICD-10-CM

## 2024-01-14 DIAGNOSIS — N951 Menopausal and female climacteric states: Secondary | ICD-10-CM | POA: Diagnosis not present

## 2024-01-14 DIAGNOSIS — M545 Low back pain, unspecified: Secondary | ICD-10-CM | POA: Insufficient documentation

## 2024-01-14 DIAGNOSIS — Z114 Encounter for screening for human immunodeficiency virus [HIV]: Secondary | ICD-10-CM

## 2024-01-14 DIAGNOSIS — R252 Cramp and spasm: Secondary | ICD-10-CM | POA: Diagnosis not present

## 2024-01-14 DIAGNOSIS — Z0000 Encounter for general adult medical examination without abnormal findings: Secondary | ICD-10-CM

## 2024-01-14 DIAGNOSIS — R262 Difficulty in walking, not elsewhere classified: Secondary | ICD-10-CM | POA: Insufficient documentation

## 2024-01-14 DIAGNOSIS — M6281 Muscle weakness (generalized): Secondary | ICD-10-CM | POA: Diagnosis not present

## 2024-01-14 DIAGNOSIS — M25562 Pain in left knee: Secondary | ICD-10-CM | POA: Diagnosis not present

## 2024-01-14 DIAGNOSIS — R293 Abnormal posture: Secondary | ICD-10-CM | POA: Insufficient documentation

## 2024-01-14 LAB — LIPID PANEL
Cholesterol: 184 mg/dL (ref 0–200)
HDL: 57.3 mg/dL (ref >39.00)
LDL Cholesterol: 110 mg/dL — ABNORMAL HIGH (ref 0–99)
NonHDL: 126.62
Total CHOL/HDL Ratio: 3
Triglycerides: 83 mg/dL (ref 0.0–149.0)
VLDL: 16.6 mg/dL (ref 0.0–40.0)

## 2024-01-14 LAB — CBC WITH DIFFERENTIAL/PLATELET
Basophils Absolute: 0 K/uL (ref 0.0–0.1)
Basophils Relative: 0.6 % (ref 0.0–3.0)
Eosinophils Absolute: 0.1 K/uL (ref 0.0–0.7)
Eosinophils Relative: 2.4 % (ref 0.0–5.0)
HCT: 42.9 % (ref 36.0–46.0)
Hemoglobin: 14.5 g/dL (ref 12.0–15.0)
Lymphocytes Relative: 28.3 % (ref 12.0–46.0)
Lymphs Abs: 1.5 K/uL (ref 0.7–4.0)
MCHC: 33.9 g/dL (ref 30.0–36.0)
MCV: 84.8 fl (ref 78.0–100.0)
Monocytes Absolute: 0.4 K/uL (ref 0.1–1.0)
Monocytes Relative: 7 % (ref 3.0–12.0)
Neutro Abs: 3.3 K/uL (ref 1.4–7.7)
Neutrophils Relative %: 61.7 % (ref 43.0–77.0)
Platelets: 348 K/uL (ref 150.0–400.0)
RBC: 5.05 Mil/uL (ref 3.87–5.11)
RDW: 11.9 % (ref 11.5–15.5)
WBC: 5.3 K/uL (ref 4.0–10.5)

## 2024-01-14 LAB — COMPREHENSIVE METABOLIC PANEL WITH GFR
ALT: 20 U/L (ref 0–35)
AST: 20 U/L (ref 0–37)
Albumin: 4.6 g/dL (ref 3.5–5.2)
Alkaline Phosphatase: 54 U/L (ref 39–117)
BUN: 9 mg/dL (ref 6–23)
CO2: 31 meq/L (ref 19–32)
Calcium: 9.5 mg/dL (ref 8.4–10.5)
Chloride: 101 meq/L (ref 96–112)
Creatinine, Ser: 0.74 mg/dL (ref 0.40–1.20)
GFR: 95.49 mL/min (ref >60.00)
Glucose, Bld: 87 mg/dL (ref 70–99)
Potassium: 4.2 meq/L (ref 3.5–5.1)
Sodium: 138 meq/L (ref 135–145)
Total Bilirubin: 0.4 mg/dL (ref 0.2–1.2)
Total Protein: 7.6 g/dL (ref 6.0–8.3)

## 2024-01-14 LAB — FOLLICLE STIMULATING HORMONE: FSH: 49.7 m[IU]/mL

## 2024-01-14 MED ORDER — ALPRAZOLAM 0.25 MG PO TABS
ORAL_TABLET | ORAL | 1 refills | Status: AC
  Filled 2024-01-14: qty 20, 10d supply, fill #0

## 2024-01-14 MED ORDER — VALACYCLOVIR HCL 1 G PO TABS
2.0000 g | ORAL_TABLET | Freq: Two times a day (BID) | ORAL | 1 refills | Status: AC
  Filled 2024-01-14: qty 40, 10d supply, fill #0

## 2024-01-14 MED ORDER — BUSPIRONE HCL 15 MG PO TABS
15.0000 mg | ORAL_TABLET | Freq: Three times a day (TID) | ORAL | 1 refills | Status: AC | PRN
Start: 2024-01-14 — End: ?
  Filled 2024-01-14: qty 45, 15d supply, fill #0

## 2024-01-14 NOTE — Patient Instructions (Signed)
 It was great to see you!  Trial buspar  15 mg as needed (may take anywhere between 5-15 mg) for acute anxiety  We will order cologuard  Let's follow-up in 1 year, sooner if you have concerns.  Take care,  Lucie Buttner PA-C

## 2024-01-14 NOTE — Therapy (Signed)
 OUTPATIENT PHYSICAL THERAPY LOWER EXTREMITY TREATMENT    Patient Name: Vanessa Daniels MRN: [id number] DOB:24-Nov-1975, 48 y.o., female Today's Date: 01/14/2024  END OF SESSION:  PT End of Session - 01/14/24 1514     Visit Number 2    Date for PT Re-Evaluation 02/20/24    Authorization Type [employer]    PT Start Time 0200    PT Stop Time 0250    PT Time Calculation (min) 50 min    Activity Tolerance Patient tolerated treatment well    Behavior During Therapy [facility] for tasks assessed/performed           Past Medical History:  Diagnosis Date   Anemia yrs ago   Anxiety    CIN 3 - cervical intraepithelial neoplasia grade 3    GERD (gastroesophageal reflux disease) 2012   Scoliosis    MILD DDD AND MILD SCOLIOSIS   Vertigo 03/28/2020   got really dizzy and anti nausea med helped   Past Surgical History:  Procedure Laterality Date   AUGMENTATION MAMMAPLASTY Bilateral 2008   CERVICAL CONIZATION W/BX N/A 03/31/2020   Procedure: CONIZATION CERVIX WITH BIOPSY with incidental IUD removal;  Surgeon: Lilton Legions, DO;  Location: [facility];  Service: Gynecology;  Laterality: N/A;  COLD KNIFE CONE   COLONOSCOPY  last done 8 yrs ago   COSMETIC SURGERY  2008   LEEP  2005   J. MATTHEWS   Patient Active Problem List   Diagnosis Date Noted   History of herpes labialis 11/19/2019   Anxiety 11/19/2019   CIN III (cervical intraepithelial neoplasia III) 04/07/2012    PCP: Job Lukes, PA  REFERRING PROVIDER: Joane Artist RAMAN, MD  REFERRING DIAG: [phone] (ICD-10-CM) - Chronic pain of left knee M54.42,G89.29 (ICD-10-CM) - Chronic left-sided low back pain with left-sided sciatica  THERAPY DIAG:  Left knee pain, unspecified chronicity  Cramp and spasm  Low back pain, unspecified back pain laterality, unspecified chronicity, unspecified whether sciatica present  Difficulty in walking, not elsewhere classified  Muscle weakness  (generalized)  Rationale for Evaluation and Treatment: Rehabilitation  ONSET DATE: 8 months ago, but getting worse  SUBJECTIVE:   SUBJECTIVE STATEMENT:  Pt reports having pain in the left knee and left buttocks area. She states she wants to be out of pain. She says she's never tried the Nustep before but is open to trying today.  PERTINENT HISTORY: OA  PAIN:  Are you having pain? Yes: NPRS scale: 9/10 for knee, 4-10 for low back Pain location: left knee and low back Pain description: dull constant ache for knee, and soreness in her back Aggravating factors: prolonged time on her feet Relieving factors: medication  PRECAUTIONS: None  RED FLAGS: None   WEIGHT BEARING RESTRICTIONS: No  FALLS:  Has patient fallen in last 6 months? 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  NEXT MD VISIT: Dr Leonce on 02/02/24  OBJECTIVE:  Note: Objective measures were completed at Evaluation unless otherwise noted.  DIAGNOSTIC FINDINGS:  Per MD notes on 12/29/23: X-ray images lumbar spine and bilateral hips obtained today personally and independently interpreted.   Lumbar spine: Mild DDD and mild to moderate facet DJD L5-S1.  No acute  fractures are visible.   Bilateral hip: No acute fractures.  Minimal degenerative changes bilateral hips.   Await formal radiology review  PATIENT SURVEYS:  LEFS  Extreme difficulty/unable (0), Quite a bit of difficulty (1), Moderate difficulty (2), Little difficulty (3), No difficulty (4) Survey date:  12/30/23  Any of your usual work, housework or school activities 2  2. Usual hobbies, recreational or sporting  activities 2  3. Getting into/out of the bath 4  4. Walking between rooms 4  5. Putting on socks/shoes 3  6. Squatting  1  7. Lifting an object, like a bag of groceries from the floor 2  8. Performing light activities around your home 4  9. Performing heavy activities around your home 2  10. Getting into/out of a car 1  11. Walking 2 blocks 4  12. Walking 1 mile 2  13. Going up/down 10 stairs (1 flight) 1  14. Standing for 1 hour 1  15.  sitting for 1 hour 1  16. Running on even ground 1  17. Running on uneven ground 0  18. Making sharp turns while running fast 0  19. Hopping  0  20. Rolling over in bed 2  Score total:  37/80 = 46.25%     COGNITION: Overall cognitive status: Within functional limits for tasks assessed     SENSATION: Patient denies numbness and tingling   MUSCLE LENGTH: Hamstrings: minimal tightness  POSTURE: rounded shoulders and forward head  PALPATION: Tender to palpation, especially on left knee  LOWER EXTREMITY ROM:  Eval:  WFL  LOWER EXTREMITY MMT:  Eval: Right hip/knee is WFL Left hip/knee is grossly 4/5 with slight pain  LOWER EXTREMITY SPECIAL TESTS:  Knee special tests: McMurray's test: positive   FUNCTIONAL TESTS:  Eval: 5 times sit to stand: 11.85 sec with decreased weight bearing through left leg Single leg stance:  right- >30 sec, left- 29.70 sec with increased sway, especially at the end  GAIT: Distance walked: >500 ft Assistive device utilized: None Level of assistance: Complete Independence Comments: 30 minutes before pain starts                                                                                                                                TREATMENT DATE:   01/14/24 Nustep level 3 for 7 min-PT student stood by monitoring patient and discussing pt status Seated hamstring stretch 2x20 sec on each side Clamshell in Rt side lying 3 x10 with blue loop-Lt side too painful Supine Clamshells 3x10 IT band  stretch in standing 3x 10 sec each side Quad stretch 1x30 sec left leg Posterior pelvic tilts in supine 3x10 Iontophoresis to Rt knee, inferior and lateral to patella with dexamethasone , informed patient of 6 hour wearing time and to remove patch if any skin irritation or discomfort   12/30/2023: Issued and reviewed HEP Discussed role of PT    PATIENT EDUCATION:  Education details: Issued HEP  and provided with green theraband Person educated: Patient Education method: Explanation, Demonstration, and Handouts Education comprehension: verbalized understanding and returned demonstration  HOME EXERCISE PROGRAM: Access Code: [email or web address] URL: [email or web address] Date: 12/30/2023 Prepared by: Jarrell Laming  Exercises - Seated Scapular Retraction  - 1 x daily - 7 x weekly - 2 sets - 10 reps - Seated Long Arc Quad  - 1 x daily - 7 x weekly - 1-2 sets - 10 reps - Seated Hamstring Stretch  - 1 x daily - 7 x weekly - 2 reps - 20 sec hold - Hooklying Clamshell with Resistance  - 1 x daily - 7 x weekly - 2 sets - 10 reps - Supine March with Resistance Band  - 1 x daily - 7 x weekly - 2 sets - 10 reps  ASSESSMENT:  CLINICAL IMPRESSION:  Cynthia presented to skilled PT today with complaints of pain in the Rt knee and Rt groin/buttocks area. She requested iontophoresis that her doctor recommended she get, to treat her knee pain. Pt positive for Trendelenburg and 3+/5 MMT on Rt glute med. Pt trialed new exercises including pelvic tilts, requiring verbal cues for proper neuromuscular activation. Initially pt appeared apprehensive to move due to pain, however mid session admitted she knew she needed to push through the pain and it would eventually diminish. Her body mechanics improved after this conversation. We added quad/hip flexor stretch due to some complaints of anterior hip pain.  Patient would benefit from skilled PT to allow her to perform her desired tasks without increased pain or  difficulty.   OBJECTIVE IMPAIRMENTS: decreased balance, difficulty walking, decreased strength, increased muscle spasms, impaired flexibility, postural dysfunction, and pain.   ACTIVITY LIMITATIONS: lifting, bending, sitting, standing, squatting, and stairs  PARTICIPATION LIMITATIONS: community activity and yard work  PERSONAL FACTORS: 1 comorbidity: OA are also affecting patient's functional outcome.   REHAB POTENTIAL: Good  CLINICAL DECISION MAKING: Stable/uncomplicated  EVALUATION COMPLEXITY: Low   GOALS: Goals reviewed with patient? Yes  SHORT TERM GOALS: Target date: 01/23/2024 Patient will be independent with initial HEP. Baseline: Goal status: INITIAL  2.  Patient will report at least a 25% improvement in symptoms since starting PT. Baseline:  Goal status: INITIAL   LONG TERM GOALS: Target date: 02/20/2024  Patient will be independent with advanced HEP. Baseline:  Goal status: INITIAL  2.  Patient will improve Lower Extremity Functional Scale to at least 60% to demonstrate improvement in functional tasks. Baseline: 46.25% Goal status: INITIAL  3.  Patient to increase left hip/knee strength to [facility] to allow her to navigate stairs at home with reciprocal pattern with increased ease. Baseline: pt reports difficulty and heavy reliance on rail Goal status: INITIAL  4.  Patient will increase left single leg stance to greater than 30 seconds without increased sway to decrease risk of falling, Baseline: 29.70 sec with increased sway Goal status: INITIAL  5.  Patient will be able to perform 5 times sit to stand in less than 10 seconds with equal weight bearing between LE to demonstrate improved functional strength. Baseline: 11.85 sec with decreased weight bearing through left leg Goal status: INITIAL   PLAN:  PT FREQUENCY: 1-2x/week  PT DURATION: 8 weeks  PLANNED INTERVENTIONS: 97164- PT Re-evaluation, 97750- Physical Performance Testing, 97110-Therapeutic  exercises, 97530- Therapeutic activity, [id number]- Neuromuscular re-education, 97535- Self Care, 02859- Manual therapy, [id number]- Gait training, [phone]- Canalith repositioning, [id number]- Aquatic Therapy, H9716- Electrical stimulation (unattended), [id number]- Electrical stimulation (manual), [id number]- Vasopneumatic device, [id number]- Ultrasound, [id number]- Traction (mechanical),  02966- Ionotophoresis 4mg /ml Dexamethasone , 20560 (1-2 muscles), 20561 (3+ muscles)- Dry Needling, Patient/Family education, Balance training, Stair training, Taping, Joint mobilization, Joint manipulation, Spinal manipulation, Spinal mobilization, Vestibular training, Cryotherapy, and Moist heat  PLAN FOR NEXT SESSION: Assess and progress HEP as indicated, strengthening, flexibility, manual/dry needling as indicated  I agree with the above treatment note after reviewing documentation. This session was performed under the supervision of a licensed clinician. Delon B. Fields, PT 01/14/24 4:58 PM  Lavanda Cleverly, SPT 01/14/24 4:48 PM   [facility] [address], [address] [place], [place] [address] Phone # [phone] Fax [phone]

## 2024-01-14 NOTE — Progress Notes (Signed)
 Subjective:    Vanessa Daniels is a 48 y.o. female and is here for a comprehensive physical exam.  HPI  Health Maintenance Due  Topic Date Due   HIV Screening  Never done   Fecal DNA (Cologuard)  12/19/2023    Discussed the use of AI scribe software for clinical note transcription with the patient, who gave verbal consent to proceed.  History of Present Illness Vanessa Daniels is a 48 year old female who presents with joint pain and mobility issues.  She experiences significant joint pain and mobility issues, particularly in her knee, hips, and lower back. MRI and x-rays show a small amount of fluid in her knee, and she has been diagnosed with arthritis. She has undergone three rounds of cortisone shots with limited success, with only one shot on the right side being effective. The pain affects her ability to engage in daily activities, including going to the gym and walking long distances. She is attending physical therapy and trying new footwear to alleviate discomfort.  She has a history of dancing for fourteen years and a skiing accident in eighth grade, with no recent trauma. Her mobility issues have impacted her travel plans, and she is concerned about an upcoming trip to [place] that will involve extensive walking.  She experiences heat and facial flushing within minutes of consuming alcohol, occurring with wine, beer, and margaritas, but not with vodka. She has reduced her alcohol intake.  She previously tried Celexa  for emotional support but discontinued it due to lack of benefit and difficulty with daily adherence. She uses Xanax  as needed for flying due to claustrophobia and is considering other as-needed options for anxiety management.  She experiences constipation managed with Miralax and occasional magnesium citrate use.   Health Maintenance: Immunizations -- utd Colonoscopy -- due for Cologuard; will order Mammogram -- utd PAP -- utd Bone Density -- n/a Diet  -- overall well balanced diet Exercise -- limited due to orthopedic issues  Sleep habits -- overall stable Mood -- see above  UTD with dentist? - yes UTD with eye doctor? - yes  Weight history: Wt Readings from Last 10 Encounters:  01/14/24 167 lb 4 oz (75.9 kg)  12/29/23 170 lb (77.1 kg)  09/04/23 170 lb (77.1 kg)  09/02/23 169 lb (76.7 kg)  06/17/23 170 lb (77.1 kg)  04/14/23 169 lb (76.7 kg)  12/31/22 162 lb (73.5 kg)  06/27/22 156 lb 6.4 oz (70.9 kg)  12/13/21 154 lb 3.2 oz (69.9 kg)  02/08/21 156 lb 6.4 oz (70.9 kg)   Body mass index is 28.71 kg/m. Patient's last menstrual period was 11/13/2023 (approximate).  Alcohol use:  reports that she does not currently use alcohol.  Tobacco use:  Tobacco Use: Medium Risk (01/14/2024)   Patient History    Smoking Tobacco Use: Former    Smokeless Tobacco Use: Never    Passive Exposure: Never   Eligible for lung cancer screening? no     09/02/2023   10:49 AM  Depression screen PHQ 2/9  Decreased Interest 1  Down, Depressed, Hopeless 1  PHQ - 2 Score 2  Altered sleeping 2  Tired, decreased energy 2  Change in appetite 2  Feeling bad or failure about yourself  1  Trouble concentrating 3  Moving slowly or fidgety/restless 0  Suicidal thoughts 0  PHQ-9 Score 12  Difficult doing work/chores Somewhat difficult     Other providers/specialists: Patient Care Team: Job Lukes, [place] as PCP - General (Physician  Assistant)    PMHx, SurgHx, SocialHx, Medications, and Allergies were reviewed in the Visit Navigator and updated as appropriate.   Past Medical History:  Diagnosis Date   Anemia yrs ago   Anxiety    CIN 3 - cervical intraepithelial neoplasia grade 3    GERD (gastroesophageal reflux disease) 2012   Scoliosis    MILD DDD AND MILD SCOLIOSIS   Vertigo 03/28/2020   got really dizzy and anti nausea med helped     Past Surgical History:  Procedure Laterality Date   AUGMENTATION MAMMAPLASTY Bilateral 2008    CERVICAL CONIZATION W/BX N/A 03/31/2020   Procedure: CONIZATION CERVIX WITH BIOPSY with incidental IUD removal;  Surgeon: Lilton Legions, DO;  Location: [facility];  Service: Gynecology;  Laterality: N/A;  COLD KNIFE CONE   COLONOSCOPY  last done 8 yrs ago   COSMETIC SURGERY  2008   LEEP  2005   J. MATTHEWS     Family History  Problem Relation Age of Onset   ADD / ADHD Mother    Hypertension Mother    Learning disabilities Mother    Vision loss Mother    Diabetes Father    Hearing loss Father    Hyperlipidemia Father    Alcohol abuse Maternal Grandmother    Asthma Maternal Grandmother    Brain cancer Maternal Grandmother    Lung cancer Maternal Grandmother    Cancer Maternal Grandmother    Lung cancer Maternal Grandfather    Diabetes Maternal Grandfather    Cancer Maternal Grandfather    Miscarriages / Stillbirths Paternal Grandmother    Prostate cancer Paternal Grandfather    Cancer Paternal Grandfather    ADD / ADHD Son    Learning disabilities Son    Intellectual disability Son    Breast cancer Neg Hx     Social History   Tobacco Use   Smoking status: Former    Current packs/day: 0.00    Average packs/day: 0.3 packs/day for 18.7 years (4.8 ttl pk-yrs)    Types: Cigarettes    Start date: 06/10/2000    Quit date: 06/10/2016    Years since quitting: 7.6    Passive exposure: Never   Smokeless tobacco: Never  Vaping Use   Vaping status: Never Used  Substance Use Topics   Alcohol use: Not Currently    Comment: rare   Drug use: No    Review of Systems:   ROS  Objective:   BP 110/76 (BP Location: Left Arm, Patient Position: Sitting, Cuff Size: Large)   Pulse 77   Temp 98.4 F (36.9 C) (Temporal)   Ht 5' 4 (1.626 m)   Wt 167 lb 4 oz (75.9 kg)   LMP 11/13/2023 (Approximate) Comment: Has an IUD  SpO2 96%   BMI 28.71 kg/m  Body mass index is 28.71 kg/m.   General Appearance:    Alert, cooperative, no distress, appears stated age  Head:     Normocephalic, without obvious abnormality, atraumatic  Eyes:    PERRL, conjunctiva/corneas clear, EOM's intact, fundi    benign, both eyes  Ears:    Normal TM's and external ear canals, both ears  Nose:   Nares normal, septum midline, mucosa normal, no drainage    or sinus tenderness  Throat:   Lips, mucosa, and tongue normal; teeth and gums normal  Neck:   Supple, symmetrical, trachea midline, no adenopathy;    thyroid:  no enlargement/tenderness/nodules; no carotid   bruit or JVD  Back:  Symmetric, no curvature, ROM normal, no CVA tenderness  Lungs:     Clear to auscultation bilaterally, respirations unlabored  Chest Wall:    No tenderness or deformity   Heart:    Regular rate and rhythm, S1 and S2 normal, no murmur, rub or gallop  Breast Exam:    Deferred  Abdomen:     Soft, non-tender, bowel sounds active all four quadrants,    no masses, no organomegaly  Genitalia:    Deferred   Extremities:   Extremities normal, atraumatic, no cyanosis or edema  Pulses:   2+ and symmetric all extremities  Skin:   Skin color, texture, turgor normal, no rashes or lesions  Lymph nodes:   Cervical, supraclavicular, and axillary nodes normal  Neurologic:   CNII-XII intact, normal strength, sensation and reflexes    throughout    Assessment/Plan:   Assessment and Plan Assessment & Plan Comprehensive Physical Exam (CPE) preventive care annual visit Today patient counseled on age appropriate routine health concerns for screening and prevention, each reviewed and up to date or declined. Immunizations reviewed and up to date or declined. Labs ordered and reviewed. Risk factors for depression reviewed and negative. Hearing function and visual acuity are intact. ADLs screened and addressed as needed. Functional ability and level of safety reviewed and appropriate. Education, counseling and referrals performed based on assessed risks today. Patient provided with a copy of personalized plan for  preventive services.  Anxiety and depression Anxiety and depression managed with Xanax  for travel-related anxiety. Celexa  discontinued due to lack of benefit and adherence issues. Discussed Buspirone  as a non-sedating, non-controlled option for acute stressors. - Refilled Xanax  for travel-related anxiety. - Prescribed Buspirone  as needed for acute anxiety episodes.  Right knee osteoarthritis with joint effusion and pain Chronic right knee osteoarthritis with joint effusion and pain, unresponsive to cortisone injections. Significant mobility issues and compensatory gait changes noted. Physical therapy ongoing with iontophoresis planned. - Continued physical therapy sessions.  Constipation Chronic constipation managed with Miralax, though adherence is inconsistent. Magnesium citrate used for acute relief. - Encouraged regular use of Miralax to prevent constipation. - Educated on risks of diverticulosis from chronic constipation.  Alcohol-induced flushing Recent onset of alcohol-induced flushing with various alcoholic beverages except vodka. Possible tannin sensitivity discussed. - Advised to avoid alcohol that triggers flushing symptoms.  Perimenopausal symptoms Perimenopausal symptoms suspected due to age and symptomatology. Interested in evaluating hormone levels to confirm perimenopause. - Ordered FSH blood test to assess hormone levels.  Recurrent oral herpes (fever blisters) Recurrent oral herpes managed with Valacyclovir . Recent episode treated with 1000 mg dose. - Refilled Valacyclovir  prescription.      Lucie Buttner, PA-C [place] [facility]

## 2024-01-15 ENCOUNTER — Encounter: Admitting: Physical Therapy

## 2024-01-15 LAB — HIV ANTIBODY (ROUTINE TESTING W REFLEX): HIV 1&2 Ab, 4th Generation: NONREACTIVE

## 2024-01-18 DIAGNOSIS — Z1211 Encounter for screening for malignant neoplasm of colon: Secondary | ICD-10-CM | POA: Diagnosis not present

## 2024-01-23 ENCOUNTER — Ambulatory Visit

## 2024-01-23 DIAGNOSIS — M6281 Muscle weakness (generalized): Secondary | ICD-10-CM

## 2024-01-23 DIAGNOSIS — R262 Difficulty in walking, not elsewhere classified: Secondary | ICD-10-CM

## 2024-01-23 DIAGNOSIS — M25562 Pain in left knee: Secondary | ICD-10-CM

## 2024-01-23 DIAGNOSIS — R252 Cramp and spasm: Secondary | ICD-10-CM

## 2024-01-23 DIAGNOSIS — M545 Low back pain, unspecified: Secondary | ICD-10-CM | POA: Diagnosis not present

## 2024-01-23 DIAGNOSIS — R293 Abnormal posture: Secondary | ICD-10-CM | POA: Diagnosis not present

## 2024-01-23 NOTE — Therapy (Signed)
 OUTPATIENT PHYSICAL THERAPY LOWER EXTREMITY TREATMENT    Patient Name: Vanessa Daniels MRN: [id number] DOB:07-08-1975, 48 y.o., female Today's Date: 01/23/2024  END OF SESSION:  PT End of Session - 01/23/24 0934     Visit Number 3    Date for PT Re-Evaluation 02/20/24    Authorization Type [employer]    PT Start Time [phone]    PT Stop Time 1020    PT Time Calculation (min) 45 min    Activity Tolerance Patient tolerated treatment well    Behavior During Therapy [facility] for tasks assessed/performed           Past Medical History:  Diagnosis Date   Anemia yrs ago   Anxiety    CIN 3 - cervical intraepithelial neoplasia grade 3    GERD (gastroesophageal reflux disease) 2012   Scoliosis    MILD DDD AND MILD SCOLIOSIS   Vertigo 03/28/2020   got really dizzy and anti nausea med helped   Past Surgical History:  Procedure Laterality Date   AUGMENTATION MAMMAPLASTY Bilateral 2008   CERVICAL CONIZATION W/BX N/A 03/31/2020   Procedure: CONIZATION CERVIX WITH BIOPSY with incidental IUD removal;  Surgeon: Lilton Legions, DO;  Location: [facility] [place];  Service: Gynecology;  Laterality: N/A;  COLD KNIFE CONE   COLONOSCOPY  last done 8 yrs ago   COSMETIC SURGERY  2008   LEEP  2005   J. MATTHEWS   Patient Active Problem List   Diagnosis Date Noted   History of herpes labialis 11/19/2019   Anxiety 11/19/2019   CIN III (cervical intraepithelial neoplasia III) 04/07/2012    PCP: Job Lukes, PA  REFERRING PROVIDER: Joane Artist RAMAN, MD  REFERRING DIAG: [id number] (ICD-10-CM) - Chronic pain of left knee M54.42,G89.29 (ICD-10-CM) - Chronic left-sided low back pain with left-sided sciatica  THERAPY DIAG:  Left knee pain, unspecified chronicity  Cramp and spasm  Low back pain, unspecified back pain laterality, unspecified chronicity, unspecified whether sciatica present  Difficulty in walking, not elsewhere classified  Muscle weakness  (generalized)  Abnormal posture  Rationale for Evaluation and Treatment: Rehabilitation  ONSET DATE: 8 months ago, but getting worse  SUBJECTIVE:   SUBJECTIVE STATEMENT: Patient reports she is doing better.  I am still having the pain but it is less intense and less frequent.  I think the treatment we did last time worked really well (referring to the ionto)   PERTINENT HISTORY: OA  PAIN:  Are you having pain? Yes: NPRS scale: 2/10 for knee, 0 for low back, tight in the pelvic area Pain location: left knee and low back Pain description: dull constant ache for knee, and soreness in her back Aggravating factors: prolonged time on her feet Relieving factors: medication  PRECAUTIONS: None  RED FLAGS: None   WEIGHT BEARING RESTRICTIONS: No  FALLS:  Has patient fallen in last 6 months? 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  NEXT MD VISIT: Dr Leonce on 02/02/24  OBJECTIVE:  Note: Objective measures were completed at Evaluation unless otherwise noted.  DIAGNOSTIC FINDINGS:  Per MD notes on 12/29/23: X-ray images lumbar spine and bilateral hips obtained today personally and independently interpreted.   Lumbar spine: Mild DDD  and mild to moderate facet DJD L5-S1.  No acute fractures are visible.   Bilateral hip: No acute fractures.  Minimal degenerative changes bilateral hips.   Await formal radiology review  PATIENT SURVEYS:  LEFS  Extreme difficulty/unable (0), Quite a bit of difficulty (1), Moderate difficulty (2), Little difficulty (3), No difficulty (4) Survey date:  12/30/23  Any of your usual work, housework or  school activities 2  2. Usual hobbies, recreational or sporting activities 2  3. Getting into/out of the bath 4  4. Walking between rooms 4  5. Putting on socks/shoes 3  6. Squatting  1  7. Lifting an object, like a bag of groceries from the floor 2  8. Performing light activities around your home 4  9. Performing heavy activities around your home 2  10. Getting into/out of a car 1  11. Walking 2 blocks 4  12. Walking 1 mile 2  13. Going up/down 10 stairs (1 flight) 1  14. Standing for 1 hour 1  15.  sitting for 1 hour 1  16. Running on even ground 1  17. Running on uneven ground 0  18. Making sharp turns while running fast 0  19. Hopping  0  20. Rolling over in bed 2  Score total:  37/80 = 46.25%     COGNITION: Overall cognitive status: Within functional limits for tasks assessed     SENSATION: Patient denies numbness and tingling   MUSCLE LENGTH: Hamstrings: minimal tightness  POSTURE: rounded shoulders and forward head  PALPATION: Tender to palpation, especially on left knee  LOWER EXTREMITY ROM:  Eval:  WFL  LOWER EXTREMITY MMT:  Eval: Right hip/knee is WFL Left hip/knee is grossly 4/5 with slight pain  LOWER EXTREMITY SPECIAL TESTS:  Knee special tests: McMurray's test: positive   FUNCTIONAL TESTS:  Eval: 5 times sit to stand: 11.85 sec with decreased weight bearing through left leg Single leg stance:  right- >30 sec, left- 29.70 sec with increased sway, especially at the end  GAIT: Distance walked: >500 ft Assistive device utilized: None Level of assistance: Complete Independence Comments: 30 minutes before pain starts                                                                                                                                TREATMENT DATE:  01/23/24 Nustep level 5 for 5 min-PT present to discuss progress and status, encouraged proper alignment on Nustep, avoiding knee valgus Lateral band walks with yellow loop x 3 laps of 15  feet Seated clam with yellow loop x 20 Hook lying clam with yellow loop x 20 Side lying clam both 2 x 10 each LE with yellow loop Seated LAQ with 5 lb aw 2 x 10 Quad sets x 20 both TKE with small green noodle with 5lb aw 2 x 10 both SAQ with blue bolster with 5 lb aw 2 x 10 both SLR 2 x 10  with 0 lbs both IT band manual rolling in right side lying x 2 min Iontophoresis #2 to Rt knee, inferior and lateral to patella with dexamethasone , informed patient of 6 hour wearing time and to remove patch if any skin irritation or discomfort  01/14/24 Nustep level 3 for 7 min-PT student stood by monitoring patient and discussing pt status Seated hamstring stretch 2x20 sec on each side Clamshell in Rt side lying 3 x10 with blue loop-Lt side too painful Supine Clamshells 3x10 IT band stretch in standing 3x 10 sec each side Quad stretch 1x30 sec left leg Posterior pelvic tilts in supine 3x10 Iontophoresis to Rt knee, inferior and lateral to patella with dexamethasone , informed patient of 6 hour wearing time and to remove patch if any skin irritation or discomfort   12/30/2023: Issued and reviewed HEP Discussed role of PT    PATIENT EDUCATION:  Education details: Issued HEP and provided with green theraband Person educated: Patient Education method: Explanation, Demonstration, and Handouts Education comprehension: verbalized understanding and returned demonstration  HOME EXERCISE PROGRAM: Access Code: [email or web address] URL: [email or web address] Date: 12/30/2023 Prepared by: Jarrell Laming  Exercises - Seated Scapular Retraction  - 1 x daily - 7 x weekly - 2 sets - 10 reps - Seated Long Arc Quad  - 1 x daily - 7 x weekly - 1-2 sets - 10 reps - Seated Hamstring Stretch  - 1 x daily - 7 x weekly - 2 reps - 20 sec hold - Hooklying Clamshell with Resistance  - 1 x daily - 7 x weekly - 2 sets - 10 reps - Supine March with Resistance Band  - 1 x daily - 7 x weekly - 2 sets - 10  reps  ASSESSMENT:  CLINICAL IMPRESSION: Ricquel is progressing appropriately.  Her back pain is resolving.  She continues to have left knee pain but this is less frequent and less intense per her report.  She was able to tolerate increased resistance on quad and hip exercises today.  We focused on hip stabilizers and quad strength today.  She had some minot pain with lateral band walks but no pain with added resistance on all other tasks.  She should continue to do well.  Patient would benefit from skilled PT to allow her to perform her desired tasks without increased pain or difficulty.   OBJECTIVE IMPAIRMENTS: decreased balance, difficulty walking, decreased strength, increased muscle spasms, impaired flexibility, postural dysfunction, and pain.   ACTIVITY LIMITATIONS: lifting, bending, sitting, standing, squatting, and stairs  PARTICIPATION LIMITATIONS: community activity and yard work  PERSONAL FACTORS: 1 comorbidity: OA are also affecting patient's functional outcome.   REHAB POTENTIAL: Good  CLINICAL DECISION MAKING: Stable/uncomplicated  EVALUATION COMPLEXITY: Low   GOALS: Goals reviewed with patient? Yes  SHORT TERM GOALS: Target date: 01/23/2024 Patient will be independent with initial HEP. Baseline: Goal status: INITIAL  2.  Patient will report at least a 25% improvement in symptoms since starting PT. Baseline:  Goal status: INITIAL   LONG TERM GOALS: Target date: 02/20/2024  Patient will be independent with advanced HEP. Baseline:  Goal status: INITIAL  2.  Patient will improve Lower Extremity Functional Scale to at least 60% to demonstrate improvement in functional tasks. Baseline: 46.25% Goal status: INITIAL  3.  Patient to increase left hip/knee strength to [place] to allow her to navigate stairs at home with reciprocal pattern with increased ease. Baseline: pt reports difficulty and heavy reliance on rail Goal status: INITIAL  4.  Patient will increase  left  single leg stance to greater than 30 seconds without increased sway to decrease risk of falling, Baseline: 29.70 sec with increased sway Goal status: INITIAL  5.  Patient will be able to perform 5 times sit to stand in less than 10 seconds with equal weight bearing between LE to demonstrate improved functional strength. Baseline: 11.85 sec with decreased weight bearing through left leg Goal status: INITIAL   PLAN:  PT FREQUENCY: 1-2x/week  PT DURATION: 8 weeks  PLANNED INTERVENTIONS: 97164- PT Re-evaluation, 97750- Physical Performance Testing, 97110-Therapeutic exercises, 97530- Therapeutic activity, [id number]- Neuromuscular re-education, 97535- Self Care, 02859- Manual therapy, [id number]- Gait training, [phone]- Canalith repositioning, [id number]- Aquatic Therapy, H9716- Electrical stimulation (unattended), [id number]- Electrical stimulation (manual), [id number]- Vasopneumatic device, [id number]- Ultrasound, [id number]- Traction (mechanical), [id number]- Ionotophoresis 4mg /ml Dexamethasone , 79439 (1-2 muscles), [id number] (3+ muscles)- Dry Needling, Patient/Family education, Balance training, Stair training, Taping, Joint mobilization, Joint manipulation, Spinal manipulation, Spinal mobilization, Vestibular training, Cryotherapy, and Moist heat  PLAN FOR NEXT SESSION: Assess response to ionto #2.  Progress HEP as indicated, strengthening, flexibility, manual/dry needling as indicated  Sander Speckman B. Sharlot Sturkey, PT 01/23/24 10:23 AM [facility] [address], [address] [place], [place] [address] Phone # [phone] Fax [phone]

## 2024-01-26 ENCOUNTER — Ambulatory Visit: Admitting: Rehabilitative and Restorative Service Providers"

## 2024-01-26 LAB — COLOGUARD: COLOGUARD: NEGATIVE

## 2024-02-02 ENCOUNTER — Encounter: Payer: Self-pay | Admitting: Rehabilitative and Restorative Service Providers"

## 2024-02-02 ENCOUNTER — Ambulatory Visit: Admitting: Rehabilitative and Restorative Service Providers"

## 2024-02-02 ENCOUNTER — Ambulatory Visit: Admitting: Sports Medicine

## 2024-02-02 DIAGNOSIS — R252 Cramp and spasm: Secondary | ICD-10-CM

## 2024-02-02 DIAGNOSIS — R262 Difficulty in walking, not elsewhere classified: Secondary | ICD-10-CM | POA: Diagnosis not present

## 2024-02-02 DIAGNOSIS — M6281 Muscle weakness (generalized): Secondary | ICD-10-CM | POA: Diagnosis not present

## 2024-02-02 DIAGNOSIS — R293 Abnormal posture: Secondary | ICD-10-CM | POA: Diagnosis not present

## 2024-02-02 DIAGNOSIS — M25562 Pain in left knee: Secondary | ICD-10-CM | POA: Diagnosis not present

## 2024-02-02 DIAGNOSIS — M545 Low back pain, unspecified: Secondary | ICD-10-CM

## 2024-02-02 NOTE — Therapy (Signed)
 OUTPATIENT PHYSICAL THERAPY TREATMENT NOTE and REASSESSMENT NOTE   Patient Name: Vanessa Daniels MRN: [id number] DOB:1975-12-15, 48 y.o., female Today's Date: 02/02/2024  END OF SESSION:  PT End of Session - 02/02/24 0852     Visit Number 4    Date for PT Re-Evaluation 04/02/24    Authorization Type [employer]    PT Start Time [id number]    PT Stop Time [phone]    PT Time Calculation (min) 48 min    Activity Tolerance Patient tolerated treatment well    Behavior During Therapy [facility] for tasks assessed/performed           Past Medical History:  Diagnosis Date   Anemia yrs ago   Anxiety    CIN 3 - cervical intraepithelial neoplasia grade 3    GERD (gastroesophageal reflux disease) 2012   Scoliosis    MILD DDD AND MILD SCOLIOSIS   Vertigo 03/28/2020   got really dizzy and anti nausea med helped   Past Surgical History:  Procedure Laterality Date   AUGMENTATION MAMMAPLASTY Bilateral 2008   CERVICAL CONIZATION W/BX N/A 03/31/2020   Procedure: CONIZATION CERVIX WITH BIOPSY with incidental IUD removal;  Surgeon: Lilton Legions, DO;  Location: [facility] [place];  Service: Gynecology;  Laterality: N/A;  COLD KNIFE CONE   COLONOSCOPY  last done 8 yrs ago   COSMETIC SURGERY  2008   LEEP  2005   J. MATTHEWS   Patient Active Problem List   Diagnosis Date Noted   History of herpes labialis 11/19/2019   Anxiety 11/19/2019   CIN III (cervical intraepithelial neoplasia III) 04/07/2012    PCP: Job Lukes, PA  REFERRING PROVIDER: Joane Artist RAMAN, MD  REFERRING DIAG: [phone] (ICD-10-CM) - Chronic pain of left knee M54.42,G89.29 (ICD-10-CM) - Chronic left-sided low back pain with left-sided sciatica  THERAPY DIAG:  Left knee pain, unspecified chronicity - Plan: PT plan of care cert/re-cert  Cramp and spasm - Plan: PT plan of care cert/re-cert  Low back pain, unspecified back pain laterality, unspecified chronicity, unspecified whether sciatica present  - Plan: PT plan of care cert/re-cert  Difficulty in walking, not elsewhere classified - Plan: PT plan of care cert/re-cert  Muscle weakness (generalized) - Plan: PT plan of care cert/re-cert  Rationale for Evaluation and Treatment: Rehabilitation  ONSET DATE: 8 months ago, but getting worse  SUBJECTIVE:   SUBJECTIVE STATEMENT: Patient states that her back is feeling 100% better and her knee is having some increased pain since last night.  Patient states that the iontopatch is helping some, but she is still having pain with it.  Patient states that she is going to [place] later this week.  PERTINENT HISTORY: OA  PAIN:  Are you having pain? Yes: NPRS scale: 6/10 Pain location: left knee Pain description: dull constant ache for knee Aggravating factors: prolonged time on her feet Relieving factors: medication  PRECAUTIONS: None  RED FLAGS: None   WEIGHT BEARING RESTRICTIONS: No  FALLS:  Has patient fallen in last 6 months? 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  NEXT MD VISIT: Dr Leonce on 02/02/24  OBJECTIVE:  Note: Objective measures were completed at Evaluation unless otherwise noted.  DIAGNOSTIC FINDINGS:  Per MD notes on 12/29/23: X-ray images lumbar spine and bilateral hips obtained today personally and independently interpreted.   Lumbar spine: Mild DDD and mild to moderate facet DJD L5-S1.  No acute fractures are visible.   Bilateral hip: No acute fractures.  Minimal degenerative changes bilateral hips.   Await formal radiology review  PATIENT SURVEYS:  Lower Extremity Functional Scale  Extreme difficulty/unable  (0), Quite a bit of difficulty (1), Moderate difficulty (2), Little difficulty (3), No difficulty (4) Survey date:  12/30/23 02/02/24  Any of your usual work, housework or school activities 2 2  2. Usual hobbies, recreational or sporting activities 2 1  3. Getting into/out of the bath 4 4  4. Walking between rooms 4 4  5. Putting on socks/shoes 3 3  6. Squatting  1 1  7. Lifting an object, like a bag of groceries from the floor 2 4  8. Performing light activities around your home 4 4  9. Performing heavy activities around your home 2 3  10. Getting into/out of a car 1 2  11. Walking 2 blocks 4 2  12. Walking 1 mile 2 2  13. Going up/down 10 stairs (1 flight) 1 1  14. Standing for 1 hour 1 1  15.  sitting for 1 hour 1 1  16. Running on even ground 1 1  17. Running on uneven ground 0 0  18. Making sharp turns while running fast 0 0  19. Hopping  0 4  20. Rolling over in bed 2 2  Score total:  37/80 = 46.25% 42/80 = 52.5%     COGNITION: Overall cognitive status: Within functional limits for tasks assessed     SENSATION: Patient denies numbness and tingling   MUSCLE LENGTH: Hamstrings: minimal tightness  POSTURE: rounded shoulders and forward head  PALPATION: Tender to palpation, especially on left knee  LOWER EXTREMITY ROM:  Eval:  WFL  LOWER EXTREMITY MMT:  Eval: Right hip/knee is WFL Left hip/knee is grossly 4/5 with slight pain  02/02/2024: Left hip/knee strength grossly 4/5 with continued limitations due to pain  LOWER EXTREMITY SPECIAL TESTS:  Knee special tests: McMurray's test: positive   FUNCTIONAL TESTS:  Eval: 5 times sit to stand: 11.85 sec with decreased weight bearing through left leg Single leg stance:  right- >30 sec, left- 29.70 sec with increased sway, especially at the end  02/02/2024: 5 times sit to stand:  8.10 sec 6 minute walk test:  1,124 ft (Age related norms are 1,848 ft)  GAIT: Distance walked: >500 ft Assistive device utilized:  None Level of assistance: Complete Independence Comments: 30 minutes before pain starts                                                                                                                                TREATMENT DATE:  02/02/2024 Nustep level 5 x5 min with PT present to discus status Sit to/from stand  x5 Lower Extremity Functional Scale 6 minute walk test 1,124 ft Supine hamstring stretch with strap 2x20 sec bilat Supine IT band stretch with strap 2x20 sec bilat Supine hip adductor stretch with strap 2x20 sec bilat Supine quad sets 2x10 bilat Supine short arc quad 2x10 bilat (with cuing for slow eccentric phase) Standing left IT band stretch x20 sec (patient reports more comfortable than supine) Iontopatch:  80 mA/min patch with 4 mg/mL Dexamethasone  with 5-6 hour wear time.  Patient verbalizes understanding of use (3rd patch)   01/23/24 Nustep level 5 for 5 min-PT present to discuss progress and status, encouraged proper alignment on Nustep, avoiding knee valgus Lateral band walks with yellow loop x 3 laps of 15 feet Seated clam with yellow loop x 20 Hook lying clam with yellow loop x 20 Side lying clam both 2 x 10 each LE with yellow loop Seated LAQ with 5 lb aw 2 x 10 Quad sets x 20 both TKE with small green noodle with 5lb aw 2 x 10 both SAQ with blue bolster with 5 lb aw 2 x 10 both SLR 2 x 10 with 0 lbs both IT band manual rolling in right side lying x 2 min Iontophoresis #2 to Rt knee, inferior and lateral to patella with dexamethasone , informed patient of 6 hour wearing time and to remove patch if any skin irritation or discomfort  01/14/24 Nustep level 3 for 7 min-PT student stood by monitoring patient and discussing pt status Seated hamstring stretch 2x20 sec on each side Clamshell in Rt side lying 3 x10 with blue loop-Lt side too painful Supine Clamshells 3x10 IT band stretch in standing 3x 10 sec each side Quad stretch 1x30 sec left leg Posterior pelvic  tilts in supine 3x10 Iontophoresis to Rt knee, inferior and lateral to patella with dexamethasone , informed patient of 6 hour wearing time and to remove patch if any skin irritation or discomfort   PATIENT EDUCATION:  Education details: Issued HEP and provided with green theraband Person educated: Patient Education method: Explanation, Demonstration, and Handouts Education comprehension: verbalized understanding and returned demonstration  HOME EXERCISE PROGRAM: Access Code: [email or web address] URL: [email or web address] Date: 02/02/2024 Prepared by: Jarrell Laming  Exercises - Seated Scapular Retraction  - 1 x daily - 7 x weekly - 2 sets - 10 reps - Seated Long Arc Quad  - 1 x daily - 7 x weekly - 1-2 sets - 10 reps - Seated Hamstring Stretch  - 1 x daily - 7 x weekly - 2 reps - 20 sec hold - Hooklying Clamshell with Resistance  - 1 x daily - 7 x weekly - 2 sets - 10 reps - Supine March with Resistance Band  - 1 x daily - 7 x weekly - 2 sets - 10 reps - Quad Set  - 1 x daily - 7 x weekly - 2 sets - 10 reps - Supine Short Arc Quad  - 1 x daily - 7 x weekly - 2 sets - 10 reps - Standing ITB Stretch  - 1 x daily - 7 x weekly - 2 reps - 20 sec hold  ASSESSMENT:  CLINICAL IMPRESSION: Jakyiah states that her back pain is 100% better and she is now only having pain with her left knee.  Patient has as slight improvement with her Lower Extremity Functional Scale score, as well as improved 5 times sit to to stand noted.  Patient was able to participate in a 6 minute walk test which was noted to be  a lower distance than her age related norms.  Patient continues to have increased pain in her left knee and states that last night, it started hurting worse again.  Patient does report some relief with use of the iontopatch.  Patient has met all of the short term goals and is progressing with long term goals.  Patient has not yet met her max potential and continues to be limited by pain, so patient  would benefit from continued PT of 1-2x/week for 8 additional weeks.   OBJECTIVE IMPAIRMENTS: decreased balance, difficulty walking, decreased strength, increased muscle spasms, impaired flexibility, postural dysfunction, and pain.   ACTIVITY LIMITATIONS: lifting, bending, sitting, standing, squatting, and stairs  PARTICIPATION LIMITATIONS: community activity and yard work  PERSONAL FACTORS: 1 comorbidity: OA are also affecting patient's functional outcome.   REHAB POTENTIAL: Good  CLINICAL DECISION MAKING: Stable/uncomplicated  EVALUATION COMPLEXITY: Low   GOALS: Goals reviewed with patient? Yes  SHORT TERM GOALS: Target date: 01/23/2024 Patient will be independent with initial HEP. Baseline: Goal status: Met on 02/02/24  2.  Patient will report at least a 25% improvement in symptoms since starting PT. Baseline:  Goal status: Met on 02/02/24 (back is 100% better, knee is 30% better on 02/02/24)   LONG TERM GOALS: Target date: 04/02/2024  Patient will be independent with advanced HEP. Baseline:  Goal status: Ongoing  2.  Patient will improve Lower Extremity Functional Scale to at least 60% to demonstrate improvement in functional tasks. Baseline: 46.25% Goal status: Ongoing (see above)  3.  Patient to increase left hip/knee strength to [facility] to allow her to navigate stairs at home with reciprocal pattern with increased ease. Baseline: pt reports difficulty and heavy reliance on rail Goal status: Ongoing  4.  Patient will increase left single leg stance to greater than 30 seconds without increased sway to decrease risk of falling, Baseline: 29.70 sec with increased sway Goal status: Ongoing  5.  Patient will be able to perform 5 times sit to stand in less than 10 seconds with equal weight bearing between LE to demonstrate improved functional strength. Baseline: 11.85 sec with decreased weight bearing through left leg Goal status: MET on 02/02/2024  6.  Patient will  improve 6 minute walk test to at least 1,600 ft without increased pain to place her closer to her age related norms and allow for community ambulation. Baseline:  1,124 ft on 02/02/24 Goal status:  NEW on 02/02/2024   PLAN:  PT FREQUENCY: 1-2x/week  PT DURATION: 8 weeks  PLANNED INTERVENTIONS: 97164- PT Re-evaluation, 97750- Physical Performance Testing, 97110-Therapeutic exercises, 97530- Therapeutic activity, 97112- Neuromuscular re-education, 97535- Self Care, 02859- Manual therapy, [phone]- Gait training, [phone]- Canalith repositioning, [id number]- Aquatic Therapy, [phone]- Electrical stimulation (unattended), [phone]- Electrical stimulation (manual), [id number]- Vasopneumatic device, [id number]- Ultrasound, [id number]- Traction (mechanical), [id number]- Ionotophoresis 4mg /ml Dexamethasone , 79439 (1-2 muscles), 20561 (3+ muscles)- Dry Needling, Patient/Family education, Balance training, Stair training, Taping, Joint mobilization, Joint manipulation, Spinal manipulation, Spinal mobilization, Vestibular training, Cryotherapy, and Moist heat  PLAN FOR NEXT SESSION: Assess response to ionto #3.  Progress HEP as indicated, strengthening, flexibility, manual/dry needling as indicated  Jarrell Laming, PT, DPT 02/02/24, 10:03 AM  [facility] [address], [address] [place], [place] [address] Phone # [phone] Fax [phone]

## 2024-02-16 ENCOUNTER — Ambulatory Visit: Attending: Family Medicine | Admitting: Rehabilitative and Restorative Service Providers"

## 2024-02-16 ENCOUNTER — Encounter: Payer: Self-pay | Admitting: Rehabilitative and Restorative Service Providers"

## 2024-02-16 DIAGNOSIS — M6281 Muscle weakness (generalized): Secondary | ICD-10-CM | POA: Insufficient documentation

## 2024-02-16 DIAGNOSIS — M545 Low back pain, unspecified: Secondary | ICD-10-CM | POA: Diagnosis not present

## 2024-02-16 DIAGNOSIS — R252 Cramp and spasm: Secondary | ICD-10-CM | POA: Insufficient documentation

## 2024-02-16 DIAGNOSIS — R293 Abnormal posture: Secondary | ICD-10-CM | POA: Insufficient documentation

## 2024-02-16 DIAGNOSIS — M25562 Pain in left knee: Secondary | ICD-10-CM | POA: Insufficient documentation

## 2024-02-16 DIAGNOSIS — R262 Difficulty in walking, not elsewhere classified: Secondary | ICD-10-CM | POA: Insufficient documentation

## 2024-02-16 NOTE — Therapy (Addendum)
 OUTPATIENT PHYSICAL THERAPY TREATMENT NOTE   Patient Name: Vanessa Daniels MRN: [id number] DOB:12-06-1975, 48 y.o., female Today's Date: 02/16/2024  END OF SESSION:  PT End of Session - 02/16/24 0947     Visit Number 5    Date for PT Re-Evaluation 04/02/24    Authorization Type [employer]    PT Start Time [phone]    PT Stop Time [phone]    PT Time Calculation (min) 40 min    Activity Tolerance Patient tolerated treatment well    Behavior During Therapy [facility] for tasks assessed/performed            Past Medical History:  Diagnosis Date   Anemia yrs ago   Anxiety    CIN 3 - cervical intraepithelial neoplasia grade 3    GERD (gastroesophageal reflux disease) 2012   Scoliosis    MILD DDD AND MILD SCOLIOSIS   Vertigo 03/28/2020   got really dizzy and anti nausea med helped   Past Surgical History:  Procedure Laterality Date   AUGMENTATION MAMMAPLASTY Bilateral 2008   CERVICAL CONIZATION W/BX N/A 03/31/2020   Procedure: CONIZATION CERVIX WITH BIOPSY with incidental IUD removal;  Surgeon: Lilton Legions, DO;  Location: [facility];  Service: Gynecology;  Laterality: N/A;  COLD KNIFE CONE   COLONOSCOPY  last done 8 yrs ago   COSMETIC SURGERY  2008   LEEP  2005   J. MATTHEWS   Patient Active Problem List   Diagnosis Date Noted   History of herpes labialis 11/19/2019   Anxiety 11/19/2019   CIN III (cervical intraepithelial neoplasia III) 04/07/2012    PCP: Job Lukes, PA  REFERRING PROVIDER: Joane Artist RAMAN, MD  REFERRING DIAG: [phone] (ICD-10-CM) - Chronic pain of left knee M54.42,G89.29 (ICD-10-CM) - Chronic left-sided low back pain with left-sided sciatica  THERAPY DIAG:  Left knee pain, unspecified chronicity  Difficulty in walking, not elsewhere classified  Cramp and spasm  Muscle weakness (generalized)  Low back pain, unspecified back pain laterality, unspecified chronicity, unspecified whether sciatica  present  Rationale for Evaluation and Treatment: Rehabilitation  ONSET DATE: 8 months ago, but getting worse  SUBJECTIVE:   SUBJECTIVE STATEMENT: Pt reports both knees are feeling much better, stating she believes it to be from taking it easy and not overdoing it. However she states she still gets shin splints in the middle of the night, flexing knee seems to help subside it and she's able to go back to sleep.   PERTINENT HISTORY: OA  PAIN:  Are you having pain? Yes: NPRS scale: 2/10 Pain location: left knee Pain description: dull constant ache for knee Aggravating factors: prolonged time on her feet Relieving factors: medication  PRECAUTIONS: None  RED FLAGS: None   WEIGHT BEARING RESTRICTIONS: No  FALLS:  Has patient fallen in last 6 months? 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  NEXT MD VISIT: Dr Leonce on 02/02/24  OBJECTIVE:  Note: Objective measures were completed at Evaluation unless otherwise noted.  DIAGNOSTIC FINDINGS:  Per MD notes on 12/29/23: X-ray images lumbar spine and bilateral hips obtained today personally and independently interpreted.   Lumbar spine: Mild DDD and mild to moderate facet DJD L5-S1.  No  acute fractures are visible.   Bilateral hip: No acute fractures.  Minimal degenerative changes bilateral hips.   Await formal radiology review  PATIENT SURVEYS:  Lower Extremity Functional Scale  Extreme difficulty/unable (0), Quite a bit of difficulty (1), Moderate difficulty (2), Little difficulty (3), No difficulty (4) Survey date:  12/30/23 02/02/24  Any of your usual work, housework or school  activities 2 2  2. Usual hobbies, recreational or sporting activities 2 1  3. Getting into/out of the bath 4 4  4. Walking between rooms 4 4  5. Putting on socks/shoes 3 3  6. Squatting  1 1  7. Lifting an object, like a bag of groceries from the floor 2 4  8. Performing light activities around your home 4 4  9. Performing heavy activities around your home 2 3  10. Getting into/out of a car 1 2  11. Walking 2 blocks 4 2  12. Walking 1 mile 2 2  13. Going up/down 10 stairs (1 flight) 1 1  14. Standing for 1 hour 1 1  15.  sitting for 1 hour 1 1  16. Running on even ground 1 1  17. Running on uneven ground 0 0  18. Making sharp turns while running fast 0 0  19. Hopping  0 4  20. Rolling over in bed 2 2  Score total:  37/80 = 46.25% 42/80 = 52.5%     COGNITION: Overall cognitive status: Within functional limits for tasks assessed     SENSATION: Patient denies numbness and tingling   MUSCLE LENGTH: Hamstrings: minimal tightness  POSTURE: rounded shoulders and forward head  PALPATION: Tender to palpation, especially on left knee  LOWER EXTREMITY ROM:  Eval:  WFL  LOWER EXTREMITY MMT:  Eval: Right hip/knee is WFL Left hip/knee is grossly 4/5 with slight pain  02/02/2024: Left hip/knee strength grossly 4/5 with continued limitations due to pain  LOWER EXTREMITY SPECIAL TESTS:  Knee special tests: McMurray's test: positive   FUNCTIONAL TESTS:  Eval: 5 times sit to stand: 11.85 sec with decreased weight bearing through left leg Single leg stance:  right- >30 sec, left- 29.70 sec with increased sway, especially at the end  02/02/2024: 5 times sit to stand:  8.10 sec 6 minute walk test:  1,124 ft (Age related norms are 1,848 ft)  GAIT: Distance walked: >500 ft Assistive device utilized: None Level of assistance: Complete Independence Comments: 30 minutes before pain starts                                                                                                                                 TREATMENT DATE:   02/16/24 Nustep level 4 x5 min with PT/SPT present to discus status Seated hamstring stretch  2x20 sec bilat Supine hip adductor stretch with strap x20 bilat Standing quad sets 2x10 bilat Seated LAQ 2x10 bilat (with cuing for slow eccentric phase) Prone hamstring curls 2x10 Standing  left IT band stretch x20 sec (patient reports more comfortable than supine) Foam rolling to bil glutes: gliding, shearing, and release Iontopatch:  80 mA/min patch with 4 mg/mL Dexamethasone  with 5-6 hour wear time.  Patient verbalizes understanding of use (4th patch)  02/02/2024 Nustep level 5 x5 min with PT present to discus status Sit to/from stand x5 Lower Extremity Functional Scale 6 minute walk test 1,124 ft Supine hamstring stretch with strap 2x20 sec bilat Supine IT band stretch with strap 2x20 sec bilat Supine hip adductor stretch with strap 2x20 sec bilat Supine quad sets 2x10 bilat Supine short arc quad 2x10 bilat (with cuing for slow eccentric phase) Standing left IT band stretch x20 sec (patient reports more comfortable than supine) Iontopatch:  80 mA/min patch with 4 mg/mL Dexamethasone  with 5-6 hour wear time.  Patient verbalizes understanding of use (3rd patch)   01/23/24 Nustep level 5 for 5 min-PT present to discuss progress and status, encouraged proper alignment on Nustep, avoiding knee valgus Lateral band walks with yellow loop x 3 laps of 15 feet Seated clam with yellow loop x 20 Hook lying clam with yellow loop x 20 Side lying clam both 2 x 10 each LE with yellow loop Seated LAQ with 5 lb aw 2 x 10 Quad sets x 20 both TKE with small green noodle with 5lb aw 2 x 10 both SAQ with blue bolster with 5 lb aw 2 x 10 both SLR 2 x 10 with 0 lbs both IT band manual foam rolling in right side lying x 2 min Iontophoresis #2 to Rt knee, inferior and lateral to patella with dexamethasone , informed patient of 6 hour wearing time and to  remove patch if any skin irritation or discomfort   PATIENT EDUCATION:  Education details: Issued HEP and provided with green theraband Person educated: Patient Education method: Explanation, Demonstration, and Handouts Education comprehension: verbalized understanding and returned demonstration  HOME EXERCISE PROGRAM: Access Code: [email or web address] URL: [email or web address] Date: 02/02/2024 Prepared by: Jarrell Laming  Exercises - Seated Scapular Retraction  - 1 x daily - 7 x weekly - 2 sets - 10 reps - Seated Long Arc Quad  - 1 x daily - 7 x weekly - 1-2 sets - 10 reps - Seated Hamstring Stretch  - 1 x daily - 7 x weekly - 2 reps - 20 sec hold - Hooklying Clamshell with Resistance  - 1 x daily - 7 x weekly - 2 sets - 10 reps - Supine March with Resistance Band  - 1 x daily - 7 x weekly - 2 sets - 10 reps - Quad Set  - 1 x daily - 7 x weekly - 2 sets - 10 reps - Supine Short Arc Quad  - 1 x daily - 7 x weekly - 2 sets - 10 reps - Standing ITB Stretch  - 1 x daily - 7 x weekly - 2 reps - 20 sec hold  ASSESSMENT:  CLINICAL IMPRESSION:  Vanessa Daniels states having significant improvement in both knees, with decreased left knee pain however still getting shin splint pain in the left leg at night. She was recommended to elevate and ice calf when this happens again. Pt demonstrated muscle tightness in the bilateral quads. Today's session focused on stretching and increasing LE strength utilizing neuromuscular reeducation component of strength training. Pt tolerated all exercises well without adverse reactions. She requested the ionto patch again today, stating it helped initially to alleviate pain in the left knee. Pt will benefit from  continuing skilled physical therapy to address the deficits below.    OBJECTIVE IMPAIRMENTS: decreased balance, difficulty walking, decreased strength, increased muscle spasms, impaired flexibility, postural dysfunction, and pain.   ACTIVITY LIMITATIONS:  lifting, bending, sitting, standing, squatting, and stairs  PARTICIPATION LIMITATIONS: community activity and yard work  PERSONAL FACTORS: 1 comorbidity: OA are also affecting patient's functional outcome.   REHAB POTENTIAL: Good  CLINICAL DECISION MAKING: Stable/uncomplicated  EVALUATION COMPLEXITY: Low   GOALS: Goals reviewed with patient? Yes  SHORT TERM GOALS: Target date: 01/23/2024 Patient will be independent with initial HEP. Baseline: Goal status: Met on 02/02/24  2.  Patient will report at least a 25% improvement in symptoms since starting PT. Baseline:  Goal status: Met on 02/02/24 (back is 100% better, knee is 30% better on 02/02/24)   LONG TERM GOALS: Target date: 04/02/2024  Patient will be independent with advanced HEP. Baseline:  Goal status: Ongoing  2.  Patient will improve Lower Extremity Functional Scale to at least 60% to demonstrate improvement in functional tasks. Baseline: 46.25% Goal status: Ongoing (see above)  3.  Patient to increase left hip/knee strength to [facility] to allow her to navigate stairs at home with reciprocal pattern with increased ease. Baseline: pt reports difficulty and heavy reliance on rail Goal status: Ongoing  4.  Patient will increase left single leg stance to greater than 30 seconds without increased sway to decrease risk of falling, Baseline: 29.70 sec with increased sway Goal status: Ongoing  5.  Patient will be able to perform 5 times sit to stand in less than 10 seconds with equal weight bearing between LE to demonstrate improved functional strength. Baseline: 11.85 sec with decreased weight bearing through left leg Goal status: MET on 02/02/2024  6.  Patient will improve 6 minute walk test to at least 1,600 ft without increased pain to place her closer to her age related norms and allow for community ambulation. Baseline:  1,124 ft on 02/02/24 Goal status:  NEW on 02/02/2024   PLAN:  PT FREQUENCY: 1-2x/week  PT  DURATION: 8 weeks  PLANNED INTERVENTIONS: 97164- PT Re-evaluation, 97750- Physical Performance Testing, 97110-Therapeutic exercises, 97530- Therapeutic activity, 97112- Neuromuscular re-education, 97535- Self Care, 02859- Manual therapy, [phone]- Gait training, [phone]- Canalith repositioning, [id number]- Aquatic Therapy, [phone]- Electrical stimulation (unattended), [phone]- Electrical stimulation (manual), [id number]- Vasopneumatic device, [id number]- Ultrasound, [id number]- Traction (mechanical), [id number]- Ionotophoresis 4mg /ml Dexamethasone , [id number] (1-2 muscles), [id number] (3+ muscles)- Dry Needling, Patient/Family education, Balance training, Stair training, Taping, Joint mobilization, Joint manipulation, Spinal manipulation, Spinal mobilization, Vestibular training, Cryotherapy, and Moist heat  PLAN FOR NEXT SESSION: [place]onto,  Progress HEP as indicated, LE strengthening, flexibility, manual/dry needling as indicated, foam rolling    Lavanda Cleverly, SPT 02/16/24 10:50 AM  I agree with the following treatment note after reviewing documentation. This session was performed under the supervision of a licensed clinician. Jarrell Laming, PT, DPT 02/16/24, 10:50 AM  [facility] [address], [address] [place], [place] [address] Phone # [phone] Fax [phone]

## 2024-02-24 ENCOUNTER — Ambulatory Visit

## 2024-02-24 DIAGNOSIS — M25562 Pain in left knee: Secondary | ICD-10-CM | POA: Diagnosis not present

## 2024-02-24 DIAGNOSIS — R293 Abnormal posture: Secondary | ICD-10-CM | POA: Diagnosis not present

## 2024-02-24 DIAGNOSIS — M545 Low back pain, unspecified: Secondary | ICD-10-CM | POA: Diagnosis not present

## 2024-02-24 DIAGNOSIS — R252 Cramp and spasm: Secondary | ICD-10-CM

## 2024-02-24 DIAGNOSIS — M6281 Muscle weakness (generalized): Secondary | ICD-10-CM | POA: Diagnosis not present

## 2024-02-24 DIAGNOSIS — R262 Difficulty in walking, not elsewhere classified: Secondary | ICD-10-CM

## 2024-02-24 NOTE — Therapy (Signed)
 OUTPATIENT PHYSICAL THERAPY TREATMENT NOTE   Patient Name: Vanessa Daniels MRN: [id number] DOB:04-23-1976, 48 y.o., female Today's Date: 02/24/2024  END OF SESSION:  PT End of Session - 02/24/24 0849     Visit Number 6    Date for PT Re-Evaluation 04/02/24    Authorization Type [employer]    PT Start Time [phone]    PT Stop Time 0930    PT Time Calculation (min) 41 min    Activity Tolerance Patient tolerated treatment well    Behavior During Therapy [facility] for tasks assessed/performed            Past Medical History:  Diagnosis Date   Anemia yrs ago   Anxiety    CIN 3 - cervical intraepithelial neoplasia grade 3    GERD (gastroesophageal reflux disease) 2012   Scoliosis    MILD DDD AND MILD SCOLIOSIS   Vertigo 03/28/2020   got really dizzy and anti nausea med helped   Past Surgical History:  Procedure Laterality Date   AUGMENTATION MAMMAPLASTY Bilateral 2008   CERVICAL CONIZATION W/BX N/A 03/31/2020   Procedure: CONIZATION CERVIX WITH BIOPSY with incidental IUD removal;  Surgeon: Lilton Legions, DO;  Location: [facility] [place];  Service: Gynecology;  Laterality: N/A;  COLD KNIFE CONE   COLONOSCOPY  last done 8 yrs ago   COSMETIC SURGERY  2008   LEEP  2005   J. MATTHEWS   Patient Active Problem List   Diagnosis Date Noted   History of herpes labialis 11/19/2019   Anxiety 11/19/2019   CIN III (cervical intraepithelial neoplasia III) 04/07/2012    PCP: Job Lukes, PA  REFERRING PROVIDER: Joane Artist RAMAN, MD  REFERRING DIAG: [phone] (ICD-10-CM) - Chronic pain of left knee M54.42,G89.29 (ICD-10-CM) - Chronic left-sided low back pain with left-sided sciatica  THERAPY DIAG:  Difficulty in walking, not elsewhere classified  Left knee pain, unspecified chronicity  Muscle weakness (generalized)  Cramp and spasm  Abnormal posture  Rationale for Evaluation and Treatment: Rehabilitation  ONSET DATE: 8 months ago, but getting  worse  SUBJECTIVE:   SUBJECTIVE STATEMENT: Pt reports both she was doing really well until she went to folk festival this weekend.  I started having pain and felt uncomfortable with all the walking.  We were there for about 2 hours when it started hurting.    PERTINENT HISTORY: OA  PAIN:  02/24/24 Are you having pain? Yes: NPRS scale: 1/10 Pain location: left knee Pain description: dull constant ache for knee Aggravating factors: prolonged time on her feet Relieving factors: medication  PRECAUTIONS: None  RED FLAGS: None   WEIGHT BEARING RESTRICTIONS: No  FALLS:  Has patient fallen in last 6 months? 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  NEXT MD VISIT: Dr Leonce on 02/02/24  OBJECTIVE:  Note: Objective measures were completed at Evaluation unless otherwise noted.  DIAGNOSTIC FINDINGS:  Per MD notes on 12/29/23: X-ray images lumbar spine and bilateral hips obtained today personally and independently interpreted.   Lumbar spine: Mild DDD and mild to moderate facet DJD L5-S1.  No acute fractures are visible.   Bilateral hip: No acute fractures.  Minimal degenerative changes bilateral hips.   Await  formal radiology review  PATIENT SURVEYS:  Lower Extremity Functional Scale  Extreme difficulty/unable (0), Quite a bit of difficulty (1), Moderate difficulty (2), Little difficulty (3), No difficulty (4) Survey date:  12/30/23 02/02/24  Any of your usual work, housework or school activities 2 2  2. Usual hobbies, recreational or sporting activities 2 1  3. Getting into/out of the bath 4 4  4. Walking between rooms 4 4  5. Putting  on socks/shoes 3 3  6. Squatting  1 1  7. Lifting an object, like a bag of groceries from the floor 2 4  8. Performing light activities around your home 4 4  9. Performing heavy activities around your home 2 3  10. Getting into/out of a car 1 2  11. Walking 2 blocks 4 2  12. Walking 1 mile 2 2  13. Going up/down 10 stairs (1 flight) 1 1  14. Standing for 1 hour 1 1  15.  sitting for 1 hour 1 1  16. Running on even ground 1 1  17. Running on uneven ground 0 0  18. Making sharp turns while running fast 0 0  19. Hopping  0 4  20. Rolling over in bed 2 2  Score total:  37/80 = 46.25% 42/80 = 52.5%     COGNITION: Overall cognitive status: Within functional limits for tasks assessed     SENSATION: Patient denies numbness and tingling   MUSCLE LENGTH: Hamstrings: minimal tightness  POSTURE: rounded shoulders and forward head  PALPATION: Tender to palpation, especially on left knee  LOWER EXTREMITY ROM:  Eval:  WFL  LOWER EXTREMITY MMT:  Eval: Right hip/knee is WFL Left hip/knee is grossly 4/5 with slight pain  02/02/2024: Left hip/knee strength grossly 4/5 with continued limitations due to pain  LOWER EXTREMITY SPECIAL TESTS:  Knee special tests: McMurray's test: positive   FUNCTIONAL TESTS:  Eval: 5 times sit to stand: 11.85 sec with decreased weight bearing through left leg Single leg stance:  right- >30 sec, left- 29.70 sec with increased sway, especially at the end  02/02/2024: 5 times sit to stand:  8.10 sec 6 minute walk test:  1,124 ft (Age related norms are 1,848 ft)  GAIT: Distance walked: >500 ft Assistive device utilized: None Level of assistance: Complete Independence Comments: 30 minutes before pain starts                                                                                                                                TREATMENT DATE:  02/24/24 Nustep level 4 x 5 min with PT present to discus status Lateral band walks x 3 laps with  yellow loop approx 15 feet Seated clam x 20 with yellow loop Supine clam with yellow loop x 20 Side lying clam with yellow loop 2 x 10 each LE Supine quad set x 20 both Supine TKE with large green noodle x 20 with 5# both Supine  TKE with blue foam roller x 20 with 5# both Supine SAQ with larger teal bolster x 20 with 5# both SLR with 5# 2 x 10 each LE  Seated hamstring stretch  2x20 sec bilat Supine hip adductor stretch with strap x20 bilat Standing quad sets 2x10 bilat Seated LAQ 2x10 bilat (with cuing for slow eccentric phase) Prone hamstring curls 2x10 Standing left IT band stretch x20 sec (patient reports more comfortable than supine) Foam rolling to bil glutes: gliding, shearing, and release Iontopatch:  80 mA/min patch with 4 mg/mL Dexamethasone  with 5-6 hour wear time.  Patient verbalizes understanding of use (4th patch)  02/16/24 Nustep level 4 x5 min with PT/SPT present to discus status Seated hamstring stretch  2x20 sec bilat Supine hip adductor stretch with strap x20 bilat Standing quad sets 2x10 bilat Seated LAQ 2x10 bilat (with cuing for slow eccentric phase) Prone hamstring curls 2x10 Standing left IT band stretch x20 sec (patient reports more comfortable than supine) Foam rolling to bil glutes: gliding, shearing, and release Iontopatch:  80 mA/min patch with 4 mg/mL Dexamethasone  with 5-6 hour wear time.  Patient verbalizes understanding of use (4th patch)  02/02/2024 Nustep level 5 x5 min with PT present to discus status Sit to/from stand x5 Lower Extremity Functional Scale 6 minute walk test 1,124 ft Supine hamstring stretch with strap 2x20 sec bilat Supine IT band stretch with strap 2x20 sec bilat Supine hip adductor stretch with strap 2x20 sec bilat Supine quad sets 2x10 bilat Supine short arc quad 2x10 bilat (with cuing for slow eccentric phase) Standing left IT band stretch x20 sec (patient reports more comfortable than supine) Iontopatch:  80 mA/min patch  with 4 mg/mL Dexamethasone  with 5-6 hour wear time.  Patient verbalizes understanding of use (3rd patch)   PATIENT EDUCATION:  Education details: Issued HEP and provided with green theraband Person educated: Patient Education method: Explanation, Demonstration, and Handouts Education comprehension: verbalized understanding and returned demonstration  HOME EXERCISE PROGRAM: Access Code: [email or web address] URL: [email or web address] Date: 02/02/2024 Prepared by: Jarrell Laming  Exercises - Seated Scapular Retraction  - 1 x daily - 7 x weekly - 2 sets - 10 reps - Seated Long Arc Quad  - 1 x daily - 7 x weekly - 1-2 sets - 10 reps - Seated Hamstring Stretch  - 1 x daily - 7 x weekly - 2 reps - 20 sec hold - Hooklying Clamshell with Resistance  - 1 x daily - 7 x weekly - 2 sets - 10 reps - Supine March with Resistance Band  - 1 x daily - 7 x weekly - 2 sets - 10 reps - Quad Set  - 1 x daily - 7 x weekly - 2 sets - 10 reps - Supine Short Arc Quad  - 1 x daily - 7 x weekly - 2 sets - 10 reps - Standing ITB Stretch  - 1 x daily - 7 x weekly - 2 reps - 20 sec hold  ASSESSMENT:  CLINICAL IMPRESSION:  Mauriah is progressing appropriately.  She had a slight setback over the weekend with doing a lot of walking at the folk festival.  She reports her pain today is back down to 1/10.  She likely had just succumb to quad fatigue when the pain started.  When she initially started PT, she had constant pain so to be able to do 2 hours before symptoms began is good progress.  We will continue to work on gaining both strength and endurance in  both hips and quads/hamstrings.   Pt will benefit from continuing skilled physical therapy to address the deficits below.    OBJECTIVE IMPAIRMENTS: decreased balance, difficulty walking, decreased strength, increased muscle spasms, impaired flexibility, postural dysfunction, and pain.   ACTIVITY LIMITATIONS: lifting, bending, sitting, standing, squatting, and  stairs  PARTICIPATION LIMITATIONS: community activity and yard work  PERSONAL FACTORS: 1 comorbidity: OA are also affecting patient's functional outcome.   REHAB POTENTIAL: Good  CLINICAL DECISION MAKING: Stable/uncomplicated  EVALUATION COMPLEXITY: Low   GOALS: Goals reviewed with patient? Yes  SHORT TERM GOALS: Target date: 01/23/2024 Patient will be independent with initial HEP. Baseline: Goal status: Met on 02/02/24  2.  Patient will report at least a 25% improvement in symptoms since starting PT. Baseline:  Goal status: Met on 02/02/24 (back is 100% better, knee is 30% better on 02/02/24)   LONG TERM GOALS: Target date: 04/02/2024  Patient will be independent with advanced HEP. Baseline:  Goal status: Ongoing  2.  Patient will improve Lower Extremity Functional Scale to at least 60% to demonstrate improvement in functional tasks. Baseline: 46.25% Goal status: Ongoing (see above)  3.  Patient to increase left hip/knee strength to [facility] to allow her to navigate stairs at home with reciprocal pattern with increased ease. Baseline: pt reports difficulty and heavy reliance on rail Goal status: Ongoing  4.  Patient will increase left single leg stance to greater than 30 seconds without increased sway to decrease risk of falling, Baseline: 29.70 sec with increased sway Goal status: Ongoing  5.  Patient will be able to perform 5 times sit to stand in less than 10 seconds with equal weight bearing between LE to demonstrate improved functional strength. Baseline: 11.85 sec with decreased weight bearing through left leg Goal status: MET on 02/02/2024  6.  Patient will improve 6 minute walk test to at least 1,600 ft without increased pain to place her closer to her age related norms and allow for community ambulation. Baseline:  1,124 ft on 02/02/24 Goal status:  NEW on 02/02/2024   PLAN:  PT FREQUENCY: 1-2x/week  PT DURATION: 8 weeks  PLANNED INTERVENTIONS: 97164- PT  Re-evaluation, 97750- Physical Performance Testing, 97110-Therapeutic exercises, 97530- Therapeutic activity, 97112- Neuromuscular re-education, 97535- Self Care, [id number]- Manual therapy, [phone]- Gait training, [phone]- Canalith repositioning, [id number]- Aquatic Therapy, [phone]- Electrical stimulation (unattended), [phone]- Electrical stimulation (manual), [id number]- Vasopneumatic device, [id number]- Ultrasound, [id number]- Traction (mechanical), [id number]- Ionotophoresis 4mg /ml Dexamethasone , [id number] (1-2 muscles), [id number] (3+ muscles)- Dry Needling, Patient/Family education, Balance training, Stair training, Taping, Joint mobilization, Joint manipulation, Spinal manipulation, Spinal mobilization, Vestibular training, Cryotherapy, and Moist heat  PLAN FOR NEXT SESSION: Progress HEP as indicated, LE strengthening focused on hips, quads and hamstrings, flexibility, manual/dry needling as indicated, foam rolling    Xzavion Doswell B. Jlyn Cerros, PT 02/24/24 9:32 AM [facility] [address] [place], [place] [address] Phone # [phone] Fax [phone]

## 2024-03-01 ENCOUNTER — Ambulatory Visit: Admitting: Rehabilitative and Restorative Service Providers"

## 2024-03-01 ENCOUNTER — Encounter: Payer: Self-pay | Admitting: Rehabilitative and Restorative Service Providers"

## 2024-03-01 DIAGNOSIS — M25562 Pain in left knee: Secondary | ICD-10-CM | POA: Diagnosis not present

## 2024-03-01 DIAGNOSIS — R293 Abnormal posture: Secondary | ICD-10-CM

## 2024-03-01 DIAGNOSIS — R262 Difficulty in walking, not elsewhere classified: Secondary | ICD-10-CM | POA: Diagnosis not present

## 2024-03-01 DIAGNOSIS — M6281 Muscle weakness (generalized): Secondary | ICD-10-CM

## 2024-03-01 DIAGNOSIS — R252 Cramp and spasm: Secondary | ICD-10-CM | POA: Diagnosis not present

## 2024-03-01 DIAGNOSIS — M545 Low back pain, unspecified: Secondary | ICD-10-CM | POA: Diagnosis not present

## 2024-03-01 NOTE — Therapy (Signed)
 OUTPATIENT PHYSICAL THERAPY TREATMENT NOTE   Patient Name: Vanessa Daniels MRN: [id number] DOB:[birth year], 48 y.o., female Today's Date: 03/01/2024  END OF SESSION:  PT End of Session - 03/01/24 0848     Visit Number 7    Date for Recertification  04/02/24    Authorization Type [employer]    PT Start Time [phone]    PT Stop Time 0930    PT Time Calculation (min) 44 min    Activity Tolerance Patient tolerated treatment well    Behavior During Therapy [facility] for tasks assessed/performed            Past Medical History:  Diagnosis Date   Anemia yrs ago   Anxiety    CIN 3 - cervical intraepithelial neoplasia grade 3    GERD (gastroesophageal reflux disease) 2012   Scoliosis    MILD DDD AND MILD SCOLIOSIS   Vertigo 03/28/2020   got really dizzy and anti nausea med helped   Past Surgical History:  Procedure Laterality Date   AUGMENTATION MAMMAPLASTY Bilateral 2008   CERVICAL CONIZATION W/BX N/A 03/31/2020   Procedure: CONIZATION CERVIX WITH BIOPSY with incidental IUD removal;  Surgeon: Lilton Legions, DO;  Location: [facility];  Service: Gynecology;  Laterality: N/A;  COLD KNIFE CONE   COLONOSCOPY  last done 8 yrs ago   COSMETIC SURGERY  2008   LEEP  2005   J. MATTHEWS   Patient Active Problem List   Diagnosis Date Noted   History of herpes labialis 11/19/2019   Anxiety 11/19/2019   CIN III (cervical intraepithelial neoplasia III) 04/07/2012    PCP: Job Lukes, PA  REFERRING PROVIDER: Joane Artist RAMAN, MD  REFERRING DIAG: [phone] (ICD-10-CM) - Chronic pain of left knee M54.42,G89.29 (ICD-10-CM) - Chronic left-sided low back pain with left-sided sciatica  THERAPY DIAG:  Difficulty in walking, not elsewhere classified  Left knee pain, unspecified chronicity  Muscle weakness (generalized)  Cramp and spasm  Abnormal posture  Low back pain, unspecified back pain laterality, unspecified chronicity, unspecified whether  sciatica present  Rationale for Evaluation and Treatment: Rehabilitation  ONSET DATE: 8 months ago, but getting worse  SUBJECTIVE:   SUBJECTIVE STATEMENT: Pt reports that she currently does not have any pain.  States that she had 1/10 tightness this morning.  Reports that she no longer has constant pain.  PERTINENT HISTORY: OA  PAIN:  Are you having pain? Yes: NPRS scale: 0-1/10 Pain location: left knee Pain description: tightness, intermittent Aggravating factors: prolonged time on her feet Relieving factors: medication  PRECAUTIONS: None  RED FLAGS: None   WEIGHT BEARING RESTRICTIONS: No  FALLS:  Has patient fallen in last 6 months? 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  NEXT MD VISIT: Dr Leonce on 02/02/24  OBJECTIVE:  Note: Objective measures were completed at Evaluation unless otherwise noted.  DIAGNOSTIC FINDINGS:  Per MD notes on 12/29/23: X-ray images lumbar spine and bilateral hips obtained today personally and independently interpreted.   Lumbar spine: Mild DDD and mild to moderate facet DJD L5-S1.  No acute fractures are visible.   Bilateral hip: No acute fractures.  Minimal degenerative changes bilateral hips.   Await formal radiology review  PATIENT SURVEYS:  Lower Extremity Functional Scale  Extreme difficulty/unable (0), Quite a bit of difficulty (1), Moderate difficulty (2), Little difficulty (3), No difficulty (4) Survey date:  12/30/23 02/02/24  Any of your usual work, housework or school activities 2 2  2. Usual hobbies, recreational or sporting activities 2 1  3. Getting into/out of the bath 4 4  4.  Walking between rooms 4 4  5. Putting on socks/shoes 3 3  6. Squatting  1 1  7. Lifting an object, like a bag of groceries from the floor 2 4  8. Performing light activities around your home 4 4  9. Performing heavy activities around your home 2 3  10. Getting into/out of a car 1 2  11. Walking 2 blocks 4 2  12. Walking 1 mile 2 2  13. Going up/down 10 stairs (1 flight) 1 1  14. Standing for 1 hour 1 1  15.  sitting for 1 hour 1 1  16. Running on even ground 1 1  17. Running on uneven ground 0 0  18. Making sharp turns while running fast 0 0  19. Hopping  0 4  20. Rolling over in bed 2 2  Score total:  37/80 = 46.25% 42/80 = 52.5%     COGNITION: Overall cognitive status: Within functional limits for tasks assessed     SENSATION: Patient denies numbness and tingling   MUSCLE LENGTH: Hamstrings: minimal tightness  POSTURE: rounded shoulders and forward head  PALPATION: Tender to palpation, especially on left knee  LOWER EXTREMITY ROM:  Eval:  WFL  LOWER EXTREMITY MMT:  Eval: Right hip/knee is WFL Left hip/knee is grossly 4/5 with slight pain  02/02/2024: Left hip/knee strength grossly 4/5 with continued limitations due to pain  LOWER EXTREMITY SPECIAL TESTS:  Knee special tests: McMurray's test: positive   FUNCTIONAL TESTS:  Eval: 5 times sit to stand: 11.85 sec with decreased weight bearing through left leg Single leg stance:  right- >30 sec, left- 29.70 sec with increased sway, especially at the end  02/02/2024: 5 times sit to stand:  8.10 sec 6 minute walk test:  1,124 ft (Age related norms are 1,848 ft)  GAIT: Distance walked: >500 ft Assistive device utilized: None Level of assistance: Complete Independence Comments: 30 minutes before pain starts                                                                                                                                TREATMENT DATE:  03/01/2024: Nustep level 5 x3 min with PT present to discuss  status 6 minute walk test:  1145 ft Lateral band walks x 3 laps bilat with yellow loop approx 15 feet Seated clam 2x20 with yellow loop Supine clam with yellow loop x 20 Side lying clam with yellow loop 2 x 10 each LE Long sitting quad sets 2x10 bilat Supine straight leg raise with 5# ankle weights 2x10 bilat  Prone hamstring curl with 5# ankle weights 2x10 bilat Prone hip extension with 5# ankle weights 2x10 bilat Seated long arc quad with 5# ankle weights 2x10 bilat   02/24/24 Nustep level 4 x 5 min with PT present to discus status Lateral band walks x 3 laps with yellow loop approx 15 feet Seated clam x 20 with yellow loop Supine clam with yellow loop x 20 Side lying clam with yellow loop 2 x 10 each LE Supine quad set x 20 both Supine TKE with large green noodle x 20 with 5# both Supine TKE with blue foam roller x 20 with 5# both Supine SAQ with larger teal bolster x 20 with 5# both SLR with 5# 2 x 10 each LE  Seated hamstring stretch  2x20 sec bilat Supine hip adductor stretch with strap x20 bilat Standing quad sets 2x10 bilat Seated LAQ 2x10 bilat (with cuing for slow eccentric phase) Prone hamstring curls 2x10 Standing left IT band stretch x20 sec (patient reports more comfortable than supine) Foam rolling to bil glutes: gliding, shearing, and release Iontopatch:  80 mA/min patch with 4 mg/mL Dexamethasone  with 5-6 hour wear time.  Patient verbalizes understanding of use (4th patch)  02/16/24 Nustep level 4 x5 min with PT/SPT present to discus status Seated hamstring stretch  2x20 sec bilat Supine hip adductor stretch with strap x20 bilat Standing quad sets 2x10 bilat Seated LAQ 2x10 bilat (with cuing for slow eccentric phase) Prone hamstring curls 2x10 Standing left IT band stretch x20 sec (patient reports more comfortable than supine) Foam rolling to bil glutes: gliding, shearing, and release Iontopatch:  80 mA/min patch with 4 mg/mL Dexamethasone  with 5-6 hour wear  time.  Patient verbalizes understanding of use (4th patch)  02/02/2024 Nustep level 5 x5 min with PT present to discus status Sit to/from stand x5 Lower Extremity Functional Scale 6 minute walk test 1,124 ft Supine hamstring stretch with strap 2x20 sec bilat Supine IT band stretch with strap 2x20 sec bilat Supine hip adductor stretch with strap 2x20 sec bilat Supine quad sets 2x10 bilat Supine short arc quad 2x10 bilat (with cuing for slow eccentric phase) Standing left IT band stretch x20 sec (patient reports more comfortable than supine) Iontopatch:  80 mA/min patch with 4 mg/mL Dexamethasone  with 5-6 hour wear time.  Patient verbalizes understanding of use (3rd patch)   PATIENT EDUCATION:  Education details: Issued HEP and provided with green theraband Person educated: Patient Education method: Explanation, Demonstration, and Handouts Education comprehension: verbalized understanding and returned demonstration  HOME EXERCISE PROGRAM: Access Code: [email or web address] URL: [email or web address] Date: 02/02/2024 Prepared by: Jarrell Laming  Exercises - Seated Scapular Retraction  - 1 x daily - 7 x weekly - 2 sets - 10 reps - Seated Long Arc Quad  - 1 x daily - 7 x weekly - 1-2 sets - 10 reps - Seated Hamstring Stretch  - 1 x daily - 7 x weekly - 2 reps - 20 sec hold - Hooklying Clamshell with Resistance  - 1 x daily - 7 x weekly - 2 sets - 10 reps - Supine March with Resistance Band  - 1 x daily - 7 x weekly - 2 sets - 10 reps - Quad Set  - 1 x daily - 7 x weekly - 2 sets - 10 reps - Supine Short Arc Quad  - 1 x daily - 7 x weekly - 2 sets - 10 reps - Standing ITB Stretch  - 1 x daily - 7 x  weekly - 2 reps - 20 sec hold  ASSESSMENT:  CLINICAL IMPRESSION:  Ms Garmon presents to skilled PT reporting that overall, her pain is getting less severe and it is no longer constant, just intermittent.  Patient continues to report that she is doing her exercises and states that she  does not feel that she needs the iontopatch currently.  Patient is able to progress with strengthening exercises throughout session with only occasional min cuing for technique.  Patient with slight improvement noted on 6 minute walk test today, but no increased pain reported.  Patient continues to require skilled PT to progress towards goal related activities.   OBJECTIVE IMPAIRMENTS: decreased balance, difficulty walking, decreased strength, increased muscle spasms, impaired flexibility, postural dysfunction, and pain.   ACTIVITY LIMITATIONS: lifting, bending, sitting, standing, squatting, and stairs  PARTICIPATION LIMITATIONS: community activity and yard work  PERSONAL FACTORS: 1 comorbidity: OA are also affecting patient's functional outcome.   REHAB POTENTIAL: Good  CLINICAL DECISION MAKING: Stable/uncomplicated  EVALUATION COMPLEXITY: Low   GOALS: Goals reviewed with patient? Yes  SHORT TERM GOALS: Target date: 01/23/2024 Patient will be independent with initial HEP. Baseline: Goal status: Met on 02/02/24  2.  Patient will report at least a 25% improvement in symptoms since starting PT. Baseline:  Goal status: Met on 02/02/24 (back is 100% better, knee is 30% better on 02/02/24)   LONG TERM GOALS: Target date: 04/02/2024  Patient will be independent with advanced HEP. Baseline:  Goal status: Ongoing  2.  Patient will improve Lower Extremity Functional Scale to at least 60% to demonstrate improvement in functional tasks. Baseline: 46.25% Goal status: Ongoing (see above)  3.  Patient to increase left hip/knee strength to [facility] to allow her to navigate stairs at home with reciprocal pattern with increased ease. Baseline: pt reports difficulty and heavy reliance on rail Goal status: Ongoing  4.  Patient will increase left single leg stance to greater than 30 seconds without increased sway to decrease risk of falling, Baseline: 29.70 sec with increased sway Goal status:  Ongoing  5.  Patient will be able to perform 5 times sit to stand in less than 10 seconds with equal weight bearing between LE to demonstrate improved functional strength. Baseline: 11.85 sec with decreased weight bearing through left leg Goal status: MET on 02/02/2024  6.  Patient will improve 6 minute walk test to at least 1,600 ft without increased pain to place her closer to her age related norms and allow for community ambulation. Baseline:  1,124 ft on 02/02/24 Goal status:  Ongoing (see above)   PLAN:  PT FREQUENCY: 1-2x/week  PT DURATION: 8 weeks  PLANNED INTERVENTIONS: 97164- PT Re-evaluation, 97750- Physical Performance Testing, 97110-Therapeutic exercises, 97530- Therapeutic activity, 97112- Neuromuscular re-education, 97535- Self Care, [id number]- Manual therapy, [phone]- Gait training, [phone]- Canalith repositioning, [phone]- Aquatic Therapy, [phone]- Electrical stimulation (unattended), [phone]- Electrical stimulation (manual), [id number]- Vasopneumatic device, [id number]- Ultrasound, [id number]- Traction (mechanical), [id number]- Ionotophoresis 4mg /ml Dexamethasone , [id number] (1-2 muscles), [id number] (3+ muscles)- Dry Needling, Patient/Family education, Balance training, Stair training, Taping, Joint mobilization, Joint manipulation, Spinal manipulation, Spinal mobilization, Vestibular training, Cryotherapy, and Moist heat  PLAN FOR NEXT SESSION: Progress HEP as indicated, LE strengthening focused on hips, quads and hamstrings, flexibility, manual/dry needling as indicated, foam rolling    Jarrell Laming, PT, DPT 03/01/24, 9:49 AM  [facility] [address], [address] [place], [place] [address] Phone # [phone] Fax [phone]

## 2024-03-08 ENCOUNTER — Other Ambulatory Visit (HOSPITAL_BASED_OUTPATIENT_CLINIC_OR_DEPARTMENT_OTHER): Payer: Self-pay

## 2024-03-08 DIAGNOSIS — N951 Menopausal and female climacteric states: Secondary | ICD-10-CM | POA: Diagnosis not present

## 2024-03-08 DIAGNOSIS — Z30431 Encounter for routine checking of intrauterine contraceptive device: Secondary | ICD-10-CM | POA: Diagnosis not present

## 2024-03-08 DIAGNOSIS — Z124 Encounter for screening for malignant neoplasm of cervix: Secondary | ICD-10-CM | POA: Diagnosis not present

## 2024-03-08 DIAGNOSIS — Z01411 Encounter for gynecological examination (general) (routine) with abnormal findings: Secondary | ICD-10-CM | POA: Diagnosis not present

## 2024-03-08 MED ORDER — LEVONORGESTREL-ETHINYL ESTRAD 0.1-20 MG-MCG PO TABS
1.0000 | ORAL_TABLET | Freq: Every day | ORAL | 3 refills | Status: AC
  Filled 2024-03-08: qty 84, 84d supply, fill #0
  Filled 2024-05-11 – 2024-05-15 (×2): qty 84, 84d supply, fill #1
  Filled 2024-07-14: qty 28, 28d supply, fill #2

## 2024-03-09 ENCOUNTER — Encounter: Payer: Self-pay | Admitting: Rehabilitative and Restorative Service Providers"

## 2024-03-09 ENCOUNTER — Ambulatory Visit: Payer: Self-pay | Admitting: Rehabilitative and Restorative Service Providers"

## 2024-03-09 DIAGNOSIS — R262 Difficulty in walking, not elsewhere classified: Secondary | ICD-10-CM | POA: Diagnosis not present

## 2024-03-09 DIAGNOSIS — R293 Abnormal posture: Secondary | ICD-10-CM | POA: Diagnosis not present

## 2024-03-09 DIAGNOSIS — R252 Cramp and spasm: Secondary | ICD-10-CM | POA: Diagnosis not present

## 2024-03-09 DIAGNOSIS — M545 Low back pain, unspecified: Secondary | ICD-10-CM

## 2024-03-09 DIAGNOSIS — M6281 Muscle weakness (generalized): Secondary | ICD-10-CM | POA: Diagnosis not present

## 2024-03-09 DIAGNOSIS — M25562 Pain in left knee: Secondary | ICD-10-CM | POA: Diagnosis not present

## 2024-03-09 NOTE — Therapy (Signed)
 OUTPATIENT PHYSICAL THERAPY TREATMENT NOTE   Patient Name: Vanessa Daniels MRN: [id number] DOB:1975/07/13, 48 y.o., female Today's Date: 03/09/2024  END OF SESSION:  PT End of Session - 03/09/24 1240     Visit Number 8    Date for Recertification  04/02/24    Authorization Type [employer]    PT Start Time 1233    PT Stop Time 1311    PT Time Calculation (min) 38 min    Activity Tolerance Patient tolerated treatment well    Behavior During Therapy [facility] for tasks assessed/performed            Past Medical History:  Diagnosis Date   Anemia yrs ago   Anxiety    CIN 3 - cervical intraepithelial neoplasia grade 3    GERD (gastroesophageal reflux disease) 2012   Scoliosis    MILD DDD AND MILD SCOLIOSIS   Vertigo 03/28/2020   got really dizzy and anti nausea med helped   Past Surgical History:  Procedure Laterality Date   AUGMENTATION MAMMAPLASTY Bilateral 2008   CERVICAL CONIZATION W/BX N/A 03/31/2020   Procedure: CONIZATION CERVIX WITH BIOPSY with incidental IUD removal;  Surgeon: Lilton Legions, DO;  Location: [facility];  Service: Gynecology;  Laterality: N/A;  COLD KNIFE CONE   COLONOSCOPY  last done 8 yrs ago   COSMETIC SURGERY  2008   LEEP  2005   J. MATTHEWS   Patient Active Problem List   Diagnosis Date Noted   History of herpes labialis 11/19/2019   Anxiety 11/19/2019   CIN III (cervical intraepithelial neoplasia III) 04/07/2012    PCP: Job Lukes, PA  REFERRING PROVIDER: Joane Artist RAMAN, MD  REFERRING DIAG: [phone] (ICD-10-CM) - Chronic pain of left knee M54.42,G89.29 (ICD-10-CM) - Chronic left-sided low back pain with left-sided sciatica  THERAPY DIAG:  Difficulty in walking, not elsewhere classified  Left knee pain, unspecified chronicity  Muscle weakness (generalized)  Cramp and spasm  Abnormal posture  Low back pain, unspecified back pain laterality, unspecified chronicity, unspecified whether  sciatica present  Rationale for Evaluation and Treatment: Rehabilitation  ONSET DATE: 8 months ago, but getting worse  SUBJECTIVE:   SUBJECTIVE STATEMENT: Pt reports that she no longer has true pain, but can feel it.  PERTINENT HISTORY: OA  PAIN:  03/09/2024 Are you having pain? Yes: NPRS scale: 0-1/10 Pain location: left knee Pain description: tightness, intermittent Aggravating factors: prolonged time on her feet Relieving factors: medication  PRECAUTIONS: None  RED FLAGS: None   WEIGHT BEARING RESTRICTIONS: No  FALLS:  Has patient fallen in last 6 months? 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  NEXT MD VISIT: Dr Leonce on 02/02/24  OBJECTIVE:  Note: Objective measures were completed at Evaluation unless otherwise noted.  DIAGNOSTIC FINDINGS:  Per MD notes on 12/29/23: X-ray images lumbar spine and bilateral hips obtained today personally and independently interpreted.   Lumbar spine: Mild DDD and mild to moderate facet DJD L5-S1.  No acute fractures are visible.   Bilateral hip: No acute fractures.  Minimal degenerative changes bilateral hips.   Await formal radiology review  PATIENT SURVEYS:  Lower Extremity Functional Scale  Extreme difficulty/unable (0), Quite a bit  of difficulty (1), Moderate difficulty (2), Little difficulty (3), No difficulty (4) Survey date:  12/30/23 02/02/24  Any of your usual work, housework or school activities 2 2  2. Usual hobbies, recreational or sporting activities 2 1  3. Getting into/out of the bath 4 4  4. Walking between rooms 4 4  5. Putting on socks/shoes 3 3  6. Squatting  1  1  7. Lifting an object, like a bag of groceries from the floor 2 4  8. Performing light activities around your home 4 4  9. Performing heavy activities around your home 2 3  10. Getting into/out of a car 1 2  11. Walking 2 blocks 4 2  12. Walking 1 mile 2 2  13. Going up/down 10 stairs (1 flight) 1 1  14. Standing for 1 hour 1 1  15.  sitting for 1 hour 1 1  16. Running on even ground 1 1  17. Running on uneven ground 0 0  18. Making sharp turns while running fast 0 0  19. Hopping  0 4  20. Rolling over in bed 2 2  Score total:  37/80 = 46.25% 42/80 = 52.5%     COGNITION: Overall cognitive status: Within functional limits for tasks assessed     SENSATION: Patient denies numbness and tingling   MUSCLE LENGTH: Hamstrings: minimal tightness  POSTURE: rounded shoulders and forward head  PALPATION: Tender to palpation, especially on left knee  LOWER EXTREMITY ROM:  Eval:  WFL  LOWER EXTREMITY MMT:  Eval: Right hip/knee is WFL Left hip/knee is grossly 4/5 with slight pain  02/02/2024: Left hip/knee strength grossly 4/5 with continued limitations due to pain  LOWER EXTREMITY SPECIAL TESTS:  Knee special tests: McMurray's test: positive   FUNCTIONAL TESTS:  Eval: 5 times sit to stand: 11.85 sec with decreased weight bearing through left leg Single leg stance:  right- >30 sec, left- 29.70 sec with increased sway, especially at the end  02/02/2024: 5 times sit to stand:  8.10 sec 6 minute walk test:  1,124 ft (Age related norms are 1,848 ft)  GAIT: Distance walked: >500 ft Assistive device utilized: None Level of assistance: Complete Independence Comments: 30 minutes before pain starts                                                                                                                                TREATMENT DATE:   03/09/2024: Nustep level 3 x6 min with PT present to discuss status Seated hamstring stretch 2x20 sec bilat Lateral band walks x 3  laps bilat with yellow loop approx 15 feet FWD/backwards monster walk x 3 laps bilat with yellow loop approx 15 feet Side lying clam with yellow loop 2 x 10 each LE Supine bridge with yellow loop 2x10 Long sitting performing leg raise over dumbbell on PT mat (tap over and back) 2x10 bilat Leg Press (seat at 6):  80# x10  with neutral feet, x10 with toe out, x10 with toe in Foam rolling to left IT band region, then bilat glutes Standing IT band stretch 2x20 sec bilat   03/01/2024: Nustep level 5 x3 min with PT present to discuss status 6 minute walk test:  1145 ft Lateral band walks x 3 laps bilat with yellow loop approx 15 feet Seated clam 2x20 with yellow loop Supine clam with yellow loop x 20 Side lying clam with yellow loop 2 x 10 each LE Long sitting quad sets 2x10 bilat Supine straight leg raise with 5# ankle weights 2x10 bilat Prone hamstring curl with 5# ankle weights 2x10 bilat Prone hip extension with 5# ankle weights 2x10 bilat Seated long arc quad with 5# ankle weights 2x10 bilat   02/24/24 Nustep level 4 x 5 min with PT present to discus status Lateral band walks x 3 laps with yellow loop approx 15 feet Seated clam x 20 with yellow loop Supine clam with yellow loop x 20 Side lying clam with yellow loop 2 x 10 each LE Supine quad set x 20 both Supine TKE with large green noodle x 20 with 5# both Supine TKE with blue foam roller x 20 with 5# both Supine SAQ with larger teal bolster x 20 with 5# both SLR with 5# 2 x 10 each LE  Seated hamstring stretch  2x20 sec bilat Supine hip adductor stretch with strap x20 bilat Standing quad sets 2x10 bilat Seated LAQ 2x10 bilat (with cuing for slow eccentric phase) Prone hamstring curls 2x10 Standing left IT band stretch x20 sec (patient reports more comfortable than supine) Foam rolling to bil glutes: gliding, shearing, and release Iontopatch:  80 mA/min patch with 4 mg/mL Dexamethasone  with 5-6 hour wear time.  Patient  verbalizes understanding of use (4th patch)    PATIENT EDUCATION:  Education details: Issued HEP and provided with green theraband Person educated: Patient Education method: Explanation, Demonstration, and Handouts Education comprehension: verbalized understanding and returned demonstration  HOME EXERCISE PROGRAM: Access Code: [email or web address] URL: [email or web address] Date: 02/02/2024 Prepared by: Jarrell Laming  Exercises - Seated Scapular Retraction  - 1 x daily - 7 x weekly - 2 sets - 10 reps - Seated Long Arc Quad  - 1 x daily - 7 x weekly - 1-2 sets - 10 reps - Seated Hamstring Stretch  - 1 x daily - 7 x weekly - 2 reps - 20 sec hold - Hooklying Clamshell with Resistance  - 1 x daily - 7 x weekly - 2 sets - 10 reps - Supine March with Resistance Band  - 1 x daily - 7 x weekly - 2 sets - 10 reps - Quad Set  - 1 x daily - 7 x weekly - 2 sets - 10 reps - Supine Short Arc Quad  - 1 x daily - 7 x weekly - 2 sets - 10 reps - Standing ITB Stretch  - 1 x daily - 7 x weekly - 2 reps - 20 sec hold  ASSESSMENT:  CLINICAL IMPRESSION:  Ms Stevick presents to skilled PT reporting that she still is not having pain.  Patient with great response to exercises.  Patient able to add in multi-position leg press today to allow for various muscle strengthening, including VMO.  Patient continues to progress with left knee stabilization/strengthening.  Patient did report some tightness at end, so ended with foam roll, then ended with IT band stretching.  Patient continues to require skilled PT to progress towards goal related  activities.   OBJECTIVE IMPAIRMENTS: decreased balance, difficulty walking, decreased strength, increased muscle spasms, impaired flexibility, postural dysfunction, and pain.   ACTIVITY LIMITATIONS: lifting, bending, sitting, standing, squatting, and stairs  PARTICIPATION LIMITATIONS: community activity and yard work  PERSONAL FACTORS: 1 comorbidity: OA are also  affecting patient's functional outcome.   REHAB POTENTIAL: Good  CLINICAL DECISION MAKING: Stable/uncomplicated  EVALUATION COMPLEXITY: Low   GOALS: Goals reviewed with patient? Yes  SHORT TERM GOALS: Target date: 01/23/2024 Patient will be independent with initial HEP. Baseline: Goal status: Met on 02/02/24  2.  Patient will report at least a 25% improvement in symptoms since starting PT. Baseline:  Goal status: Met on 02/02/24 (back is 100% better, knee is 30% better on 02/02/24)   LONG TERM GOALS: Target date: 04/02/2024  Patient will be independent with advanced HEP. Baseline:  Goal status: Ongoing  2.  Patient will improve Lower Extremity Functional Scale to at least 60% to demonstrate improvement in functional tasks. Baseline: 46.25% Goal status: Ongoing (see above)  3.  Patient to increase left hip/knee strength to [facility] to allow her to navigate stairs at home with reciprocal pattern with increased ease. Baseline: pt reports difficulty and heavy reliance on rail Goal status: Ongoing  4.  Patient will increase left single leg stance to greater than 30 seconds without increased sway to decrease risk of falling, Baseline: 29.70 sec with increased sway Goal status: Ongoing  5.  Patient will be able to perform 5 times sit to stand in less than 10 seconds with equal weight bearing between LE to demonstrate improved functional strength. Baseline: 11.85 sec with decreased weight bearing through left leg Goal status: MET on 02/02/2024  6.  Patient will improve 6 minute walk test to at least 1,600 ft without increased pain to place her closer to her age related norms and allow for community ambulation. Baseline:  1,124 ft on 02/02/24 Goal status:  Ongoing (see above)   PLAN:  PT FREQUENCY: 1-2x/week  PT DURATION: 8 weeks  PLANNED INTERVENTIONS: 97164- PT Re-evaluation, 97750- Physical Performance Testing, 97110-Therapeutic exercises, 97530- Therapeutic activity, 97112-  Neuromuscular re-education, 97535- Self Care, 02859- Manual therapy, [phone]- Gait training, [phone]- Canalith repositioning, [phone]- Aquatic Therapy, [phone]- Electrical stimulation (unattended), [phone]- Electrical stimulation (manual), [id number]- Vasopneumatic device, [id number]- Ultrasound, [id number]- Traction (mechanical), [id number]- Ionotophoresis 4mg /ml Dexamethasone , [id number] (1-2 muscles), [id number] (3+ muscles)- Dry Needling, Patient/Family education, Balance training, Stair training, Taping, Joint mobilization, Joint manipulation, Spinal manipulation, Spinal mobilization, Vestibular training, Cryotherapy, and Moist heat  PLAN FOR NEXT SESSION: Progress HEP as indicated, LE strengthening focused on hips, quads and hamstrings, flexibility, manual/dry needling as indicated, foam rolling    Jarrell Laming, PT, DPT 03/09/24, 1:23 PM  [facility] [address] [place], [place] [address] Phone # [phone] Fax [phone]

## 2024-03-16 ENCOUNTER — Encounter

## 2024-03-22 ENCOUNTER — Ambulatory Visit: Attending: Family Medicine | Admitting: Rehabilitative and Restorative Service Providers"

## 2024-03-22 ENCOUNTER — Encounter: Payer: Self-pay | Admitting: Rehabilitative and Restorative Service Providers"

## 2024-03-22 DIAGNOSIS — R262 Difficulty in walking, not elsewhere classified: Secondary | ICD-10-CM | POA: Insufficient documentation

## 2024-03-22 DIAGNOSIS — M545 Low back pain, unspecified: Secondary | ICD-10-CM | POA: Diagnosis not present

## 2024-03-22 DIAGNOSIS — M6281 Muscle weakness (generalized): Secondary | ICD-10-CM | POA: Diagnosis not present

## 2024-03-22 DIAGNOSIS — R252 Cramp and spasm: Secondary | ICD-10-CM | POA: Insufficient documentation

## 2024-03-22 DIAGNOSIS — R293 Abnormal posture: Secondary | ICD-10-CM | POA: Insufficient documentation

## 2024-03-22 DIAGNOSIS — M25562 Pain in left knee: Secondary | ICD-10-CM | POA: Diagnosis not present

## 2024-03-22 NOTE — Therapy (Signed)
 OUTPATIENT PHYSICAL THERAPY TREATMENT NOTE   Patient Name: Vanessa Daniels MRN: [id number] DOB:[birth year], 48 y.o., female Today's Date: 03/22/2024  END OF SESSION:  PT End of Session - 03/22/24 0849     Visit Number 9    Date for Recertification  04/02/24    Authorization Type [employer]    PT Start Time [phone]    PT Stop Time 0925    PT Time Calculation (min) 38 min    Activity Tolerance Patient tolerated treatment well    Behavior During Therapy [facility] for tasks assessed/performed            Past Medical History:  Diagnosis Date   Anemia yrs ago   Anxiety    CIN 3 - cervical intraepithelial neoplasia grade 3    GERD (gastroesophageal reflux disease) 2012   Scoliosis    MILD DDD AND MILD SCOLIOSIS   Vertigo 03/28/2020   got really dizzy and anti nausea med helped   Past Surgical History:  Procedure Laterality Date   AUGMENTATION MAMMAPLASTY Bilateral 2008   CERVICAL CONIZATION W/BX N/A 03/31/2020   Procedure: CONIZATION CERVIX WITH BIOPSY with incidental IUD removal;  Surgeon: Lilton Legions, DO;  Location: [facility];  Service: Gynecology;  Laterality: N/A;  COLD KNIFE CONE   COLONOSCOPY  last done 8 yrs ago   COSMETIC SURGERY  2008   LEEP  2005   J. MATTHEWS   Patient Active Problem List   Diagnosis Date Noted   History of herpes labialis 11/19/2019   Anxiety 11/19/2019   CIN III (cervical intraepithelial neoplasia III) 04/07/2012    PCP: Job Lukes, PA  REFERRING PROVIDER: Joane Artist RAMAN, MD  REFERRING DIAG: [phone] (ICD-10-CM) - Chronic pain of left knee M54.42,G89.29 (ICD-10-CM) - Chronic left-sided low back pain with left-sided sciatica  THERAPY DIAG:  Difficulty in walking, not elsewhere classified  Left knee pain, unspecified chronicity  Muscle weakness (generalized)  Cramp and spasm  Abnormal posture  Low back pain, unspecified back pain laterality, unspecified chronicity, unspecified whether  sciatica present  Rationale for Evaluation and Treatment: Rehabilitation  ONSET DATE: 8 months ago, but getting worse  SUBJECTIVE:   SUBJECTIVE STATEMENT: Pt reports that she was doing great, but is now having some return of the thigh pain and knee tightness.  States that she went for a 1.5 mile walk yesterday to try to walk it off.  PERTINENT HISTORY: OA  PAIN:  Are you having pain? Yes: NPRS scale: 2/10 Pain location: left thigh Pain description: tightness, intermittent Aggravating factors: prolonged time on her feet Relieving factors: medication  PRECAUTIONS: None  RED FLAGS: None   WEIGHT BEARING RESTRICTIONS: No  FALLS:  Has patient fallen in last 6 months? 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  NEXT MD VISIT: Dr Leonce on 02/02/24  OBJECTIVE:  Note: Objective measures were completed at Evaluation unless otherwise noted.  DIAGNOSTIC FINDINGS:  Per MD notes on 12/29/23: X-ray images lumbar spine and bilateral hips obtained today personally and independently interpreted.   Lumbar spine: Mild DDD and mild to moderate facet DJD L5-S1.  No acute fractures are visible.   Bilateral hip: No acute fractures.  Minimal degenerative changes  bilateral hips.   Await formal radiology review  PATIENT SURVEYS:  Lower Extremity Functional Scale  Extreme difficulty/unable (0), Quite a bit of difficulty (1), Moderate difficulty (2), Little difficulty (3), No difficulty (4) Survey date:  12/30/23 02/02/24  Any of your usual work, housework or school activities 2 2  2. Usual hobbies, recreational or sporting activities 2 1  3. Getting  into/out of the bath 4 4  4. Walking between rooms 4 4  5. Putting on socks/shoes 3 3  6. Squatting  1 1  7. Lifting an object, like a bag of groceries from the floor 2 4  8. Performing light activities around your home 4 4  9. Performing heavy activities around your home 2 3  10. Getting into/out of a car 1 2  11. Walking 2 blocks 4 2  12. Walking 1 mile 2 2  13. Going up/down 10 stairs (1 flight) 1 1  14. Standing for 1 hour 1 1  15.  sitting for 1 hour 1 1  16. Running on even ground 1 1  17. Running on uneven ground 0 0  18. Making sharp turns while running fast 0 0  19. Hopping  0 4  20. Rolling over in bed 2 2  Score total:  37/80 = 46.25% 42/80 = 52.5%     COGNITION: Overall cognitive status: Within functional limits for tasks assessed     SENSATION: Patient denies numbness and tingling   MUSCLE LENGTH: Hamstrings: minimal tightness  POSTURE: rounded shoulders and forward head  PALPATION: Tender to palpation, especially on left knee  LOWER EXTREMITY ROM:  Eval:  WFL  LOWER EXTREMITY MMT:  Eval: Right hip/knee is WFL Left hip/knee is grossly 4/5 with slight pain  02/02/2024: Left hip/knee strength grossly 4/5 with continued limitations due to pain  LOWER EXTREMITY SPECIAL TESTS:  Knee special tests: McMurray's test: positive   FUNCTIONAL TESTS:  Eval: 5 times sit to stand: 11.85 sec with decreased weight bearing through left leg Single leg stance:  right- >30 sec, left- 29.70 sec with increased sway, especially at the end  02/02/2024: 5 times sit to stand:  8.10 sec 6 minute walk test:  1,124 ft (Age related norms are 1,848 ft)  GAIT: Distance walked: >500 ft Assistive device utilized: None Level of assistance: Complete Independence Comments: 30 minutes before pain starts                                                                                                                                TREATMENT DATE:   03/22/2024: Nustep level 6  x5 min with PT present to discuss status Standing hamstring stretch at stairs x20 sec bilat Supine hamstring stretch with strap x20 sec bilat Supine IT band stretch with strap x20 sec bilat Supine hip adductor stretch with strap x20 sec bilat Manual therapy:  PT performing rolling with rolling instrument to left quad and IT band region  Foam rolling to left IT band region, then bilat glutes Prone quad stretch with strap x20 sec bilat Side lying clam with yellow loop 2 x 10 each LE Supine bridge with yellow loop 2x10 Manual:  kinesiotape applied to IT band with one vertical strip to IT band with 50-75% stretch and one horizontal strip to specific area of pain at 50-75% stretch to improve tissue mobility and decrease pain.   03/09/2024: Nustep level 3 x6 min with PT present to discuss status Seated hamstring stretch 2x20 sec bilat Lateral band walks x 3 laps bilat with yellow loop approx 15 feet FWD/backwards monster walk x 3 laps bilat with yellow loop approx 15 feet Side lying clam with yellow loop 2 x 10 each LE Supine bridge with yellow loop 2x10 Long sitting performing leg raise over dumbbell on PT mat (tap over and back) 2x10 bilat Leg Press (seat at 6):  80# x10 with neutral feet, x10 with toe out, x10 with toe in Foam rolling to left IT band region, then bilat glutes Standing IT band stretch 2x20 sec bilat   03/01/2024: Nustep level 5 x3 min with PT present to discuss status 6 minute walk test:  1145 ft Lateral band walks x 3 laps bilat with yellow loop approx 15 feet Seated clam 2x20 with yellow loop Supine clam with yellow loop x 20 Side lying clam with yellow loop 2 x 10 each LE Long sitting quad sets 2x10 bilat Supine straight leg raise with 5# ankle weights 2x10 bilat Prone hamstring curl with 5# ankle weights 2x10 bilat Prone hip extension with 5# ankle weights 2x10 bilat Seated long arc quad with 5# ankle weights 2x10 bilat    PATIENT EDUCATION:  Education details:  Issued HEP and provided with green theraband Person educated: Patient Education method: Explanation, Demonstration, and Handouts Education comprehension: verbalized understanding and returned demonstration  HOME EXERCISE PROGRAM: Access Code: [email or web address] URL: [email or web address] Date: 02/02/2024 Prepared by: Jarrell Laming  Exercises - Seated Scapular Retraction  - 1 x daily - 7 x weekly - 2 sets - 10 reps - Seated Long Arc Quad  - 1 x daily - 7 x weekly - 1-2 sets - 10 reps - Seated Hamstring Stretch  - 1 x daily - 7 x weekly - 2 reps - 20 sec hold - Hooklying Clamshell with Resistance  - 1 x daily - 7 x weekly - 2 sets - 10 reps - Supine March with Resistance Band  - 1 x daily - 7 x weekly - 2 sets - 10 reps - Quad Set  - 1 x daily - 7 x weekly - 2 sets - 10 reps - Supine Short Arc Quad  - 1 x daily - 7 x weekly - 2 sets - 10 reps - Standing ITB Stretch  - 1 x daily - 7 x weekly - 2 reps - 20 sec hold  ASSESSMENT:  CLINICAL IMPRESSION:  Ms Bankson presents to skilled PT reporting that she was feeling better, but in past couple of days, she has started having increased pain in her lateral thigh region.  Patient instructed on stretching in various areas today to attempt to decrease pain.  Patient with increased pain with soft tissue work with foam roller and roller wand applied by PT.  Patient was educated about the potential benefits of kinesiotape and she was agreeable to application.  Utilized 2 I strips to assist with increased spacing and decreased pain.  Patient education to leave in place until the  tape starts to unravel at ends (however, if she is noting discomfort, she can remove earlier with lotion and in the shower), she verbalizes understanding.   OBJECTIVE IMPAIRMENTS: decreased balance, difficulty walking, decreased strength, increased muscle spasms, impaired flexibility, postural dysfunction, and pain.   ACTIVITY LIMITATIONS: lifting, bending, sitting,  standing, squatting, and stairs  PARTICIPATION LIMITATIONS: community activity and yard work  PERSONAL FACTORS: 1 comorbidity: OA are also affecting patient's functional outcome.   REHAB POTENTIAL: Good  CLINICAL DECISION MAKING: Stable/uncomplicated  EVALUATION COMPLEXITY: Low   GOALS: Goals reviewed with patient? Yes  SHORT TERM GOALS: Target date: 01/23/2024 Patient will be independent with initial HEP. Baseline: Goal status: Met on 02/02/24  2.  Patient will report at least a 25% improvement in symptoms since starting PT. Baseline:  Goal status: Met on 02/02/24 (back is 100% better, knee is 30% better on 02/02/24)   LONG TERM GOALS: Target date: 04/02/2024  Patient will be independent with advanced HEP. Baseline:  Goal status: Ongoing  2.  Patient will improve Lower Extremity Functional Scale to at least 60% to demonstrate improvement in functional tasks. Baseline: 46.25% Goal status: Ongoing (see above)  3.  Patient to increase left hip/knee strength to [facility] to allow her to navigate stairs at home with reciprocal pattern with increased ease. Baseline: pt reports difficulty and heavy reliance on rail Goal status: Ongoing  4.  Patient will increase left single leg stance to greater than 30 seconds without increased sway to decrease risk of falling, Baseline: 29.70 sec with increased sway Goal status: Ongoing  5.  Patient will be able to perform 5 times sit to stand in less than 10 seconds with equal weight bearing between LE to demonstrate improved functional strength. Baseline: 11.85 sec with decreased weight bearing through left leg Goal status: MET on 02/02/2024  6.  Patient will improve 6 minute walk test to at least 1,600 ft without increased pain to place her closer to her age related norms and allow for community ambulation. Baseline:  1,124 ft on 02/02/24 Goal status:  Ongoing (see above)   PLAN:  PT FREQUENCY: 1-2x/week  PT DURATION: 8 weeks  PLANNED  INTERVENTIONS: 97164- PT Re-evaluation, 97750- Physical Performance Testing, 97110-Therapeutic exercises, 97530- Therapeutic activity, 97112- Neuromuscular re-education, 97535- Self Care, 02859- Manual therapy, [phone]- Gait training, [phone]- Canalith repositioning, [phone]- Aquatic Therapy, [phone]- Electrical stimulation (unattended), [phone]- Electrical stimulation (manual), [id number]- Vasopneumatic device, [id number]- Ultrasound, [id number]- Traction (mechanical), [id number]- Ionotophoresis 4mg /ml Dexamethasone , 79439 (1-2 muscles), 20561 (3+ muscles)- Dry Needling, Patient/Family education, Balance training, Stair training, Taping, Joint mobilization, Joint manipulation, Spinal manipulation, Spinal mobilization, Vestibular training, Cryotherapy, and Moist heat  PLAN FOR NEXT SESSION: Progress HEP as indicated, LE strengthening focused on hips, quads and hamstrings, flexibility, manual/dry needling as indicated, foam rolling    Jarrell Laming, PT, DPT 03/22/24, 11:26 AM  [facility] [address], [address] [place], [place] [address] Phone # [phone] Fax [phone]

## 2024-03-29 ENCOUNTER — Ambulatory Visit: Admitting: Rehabilitative and Restorative Service Providers"

## 2024-03-29 DIAGNOSIS — N750 Cyst of Bartholin's gland: Secondary | ICD-10-CM | POA: Diagnosis not present

## 2024-04-05 ENCOUNTER — Ambulatory Visit: Admitting: Rehabilitative and Restorative Service Providers"

## 2024-04-14 DIAGNOSIS — N750 Cyst of Bartholin's gland: Secondary | ICD-10-CM | POA: Diagnosis not present

## 2024-04-19 ENCOUNTER — Encounter: Payer: Self-pay | Admitting: Dermatology

## 2024-04-19 ENCOUNTER — Ambulatory Visit: Admitting: Dermatology

## 2024-04-19 VITALS — BP 110/67 | HR 82

## 2024-04-19 DIAGNOSIS — L719 Rosacea, unspecified: Secondary | ICD-10-CM | POA: Diagnosis not present

## 2024-04-19 DIAGNOSIS — L708 Other acne: Secondary | ICD-10-CM

## 2024-04-19 NOTE — Patient Instructions (Addendum)
 VISIT SUMMARY:  You had a follow-up appointment today to discuss your rosacea and related skin concerns. Your rosacea is currently well-controlled, and you have not needed to use doxycycline  or ivermectin  recently. We also discussed occasional breakouts related to your glasses and concerns about enlarged facial pores.  YOUR PLAN:  -ROSACEA: Rosacea is a chronic skin condition that causes redness and visible blood vessels in your face. Your rosacea is well-controlled at the moment. Continue using ivermectin  as needed for any flares. You can keep doxycycline  on hand for severe flares and use it once daily for 5-7 days if necessary. You no longer need to use pimecrolimus .  -ACNEIFORM ERUPTIONS RELATED TO GLASSES: These are acne-like breakouts that occur due to your glasses rubbing against your skin, causing clogged hair follicles and oil glands. Use benzoyl peroxide as a spot treatment in the morning to manage these breakouts.  -ENLARGED FACIAL PORES: Enlarged facial pores can be due to excess oil production and loss of skin elasticity. Retinol can help by slowing oil production and stimulating collagen, which also helps prevent wrinkles. Use the Avend brand retinol every other night, or every two nights if your skin feels dry. Be aware that retinol can cause dryness, especially in autumn weather. You can combine retinol with ivermectin  if you experience a flare.  INSTRUCTIONS:  Please follow up as needed if you experience any severe flares or if you have any concerns about your skin condition.    Important Information  Due to recent changes in healthcare laws, you may see results of your pathology and/or laboratory studies on MyChart before the doctors have had a chance to review them. We understand that in some cases there may be results that are confusing or concerning to you. Please understand that not all results are received at the same time and often the doctors may need to interpret multiple  results in order to provide you with the best plan of care or course of treatment. Therefore, we ask that you please give us  2 business days to thoroughly review all your results before contacting the office for clarification. Should we see a critical lab result, you will be contacted sooner.   If You Need Anything After Your Visit  If you have any questions or concerns for your doctor, please call our main line at [phone] If no one answers, please leave a voicemail as directed and we will return your call as soon as possible. Messages left after 4 pm will be answered the following business day.   You may also send us  a message via MyChart. We typically respond to MyChart messages within 1-2 business days.  For prescription refills, please ask your pharmacy to contact our office. Our fax number is [phone].  If you have an urgent issue when the clinic is closed that cannot wait until the next business day, you can page your doctor at the number below.    Please note that while we do our best to be available for urgent issues outside of office hours, we are not available 24/7.   If you have an urgent issue and are unable to reach us , you may choose to seek medical care at your doctor's office, retail clinic, urgent care center, or emergency room.  If you have a medical emergency, please immediately call 911 or go to the emergency department. In the event of inclement weather, please call our main line at [phone] for an update on the status of any delays or closures.  Dermatology Medication Tips: Please keep the boxes that topical medications come in in order to help keep track of the instructions about where and how to use these. Pharmacies typically print the medication instructions only on the boxes and not directly on the medication tubes.   If your medication is too expensive, please contact our office at [phone] or send us  a message through MyChart.   We are unable to  tell what your co-pay for medications will be in advance as this is different depending on your insurance coverage. However, we may be able to find a substitute medication at lower cost or fill out paperwork to get insurance to cover a needed medication.   If a prior authorization is required to get your medication covered by your insurance company, please allow us  1-2 business days to complete this process.  Drug prices often vary depending on where the prescription is filled and some pharmacies may offer cheaper prices.  The website www.goodrx.com contains coupons for medications through different pharmacies. The prices here do not account for what the cost may be with help from insurance (it may be cheaper with your insurance), but the website can give you the price if you did not use any insurance.  - You can print the associated coupon and take it with your prescription to the pharmacy.  - You may also stop by our office during regular business hours and pick up a GoodRx coupon card.  - If you need your prescription sent electronically to a different pharmacy, notify our office through [facility] or by phone at [phone]

## 2024-04-19 NOTE — Progress Notes (Signed)
   Follow-Up Visit  Patient (and/or pt guardian) consented to the use of AI-assisted tools for note generation.    Subjective  Vanessa Daniels is a 48 y.o. female who presents for the following: Rosacea/perioral dermatitis  Patient was last evaluated on 10/16/23.  At this visit patient was prescribed ivermectin  and pimecrolimus  creams to use together twice daily Prescribed Cicaplast for irritation Advised to continue with doxycycline  for flares Recommended using aquaphor to areas at night Patient reports sxs are better. Patient reports symptoms are much better and has also stopped using make up and drinking alcohol. Since stopping make up use patient reports she has no other issues other than random break outs that she this is due to her glasses. In the event of a breakout or redness, patient reports she uses ivermectin  to help and it calms down within a day or so Patient denies medication changes.  The following portions of the chart were reviewed this encounter and updated as appropriate: medications, allergies, medical history  Review of Systems:  No other skin or systemic complaints except as noted in HPI or Assessment and Plan.  Objective  Well appearing patient in no apparent distress; mood and affect are within normal limits.  A focused examination was performed of the following areas: Cheek and Perioral area  Relevant exam findings are noted in the Assessment and Plan.            Assessment & Plan   Rosacea Well-controlled with no recent flares requiring doxycycline . Ivermectin  is used as needed for flares, with the last use two months ago. Makeup and cleaning tools may have contributed to previous flares. - Continue ivermectin  as needed for flares. - Discontinued Pimeco lima. - Keep doxycycline  on hand for severe flares, use once daily for 5-7 days if needed.  Acneiform eruptions related to glasses Occasional acneiform eruptions likely due to glasses rubbing on  the skin, causing clogged hair follicles and oil glands. - Use benzoyl peroxide spot treatment in the morning for acneiform eruptions.  Enlarged facial pores Enlarged facial pores noted. Retinol recommended to slow oil production and stimulate collagen, aiding in wrinkle prevention. Avend brand retinol suggested for its hydrating properties. Dryness is a potential side effect, especially in autumn weather. - Use retinol every other night or every two nights if skin is dry. - Provided samples of Avend retinol for trial. - Combine retinol with ivermectin  if experiencing a flare.    Return for FBSE in May 2026 (already scheduled per PT).  I, Lyle Cords, as acting as a neurosurgeon for [employer], DO .    Documentation: I have reviewed the above documentation for accuracy and completeness, and I agree with the above.  Delon Lenis, DO

## 2024-05-11 ENCOUNTER — Other Ambulatory Visit (HOSPITAL_BASED_OUTPATIENT_CLINIC_OR_DEPARTMENT_OTHER): Payer: Self-pay

## 2024-05-15 ENCOUNTER — Other Ambulatory Visit (HOSPITAL_BASED_OUTPATIENT_CLINIC_OR_DEPARTMENT_OTHER): Payer: Self-pay

## 2024-05-17 ENCOUNTER — Other Ambulatory Visit (HOSPITAL_BASED_OUTPATIENT_CLINIC_OR_DEPARTMENT_OTHER): Payer: Self-pay

## 2024-07-14 ENCOUNTER — Other Ambulatory Visit (HOSPITAL_BASED_OUTPATIENT_CLINIC_OR_DEPARTMENT_OTHER): Payer: Self-pay

## 2024-07-19 ENCOUNTER — Ambulatory Visit: Admitting: Physician Assistant

## 2024-10-18 ENCOUNTER — Ambulatory Visit: Admitting: Dermatology

## 2025-01-17 ENCOUNTER — Encounter: Admitting: Physician Assistant
